# Patient Record
Sex: Female | Born: 1970 | Race: White | Hispanic: No | Marital: Married | Smoking: Never smoker
Health system: Southern US, Community
[De-identification: ages and names within clinical notes are randomized; demographics above are authoritative.]

## PROBLEM LIST (undated history)

## (undated) DIAGNOSIS — M7500 Adhesive capsulitis of unspecified shoulder: Secondary | ICD-10-CM

## (undated) DIAGNOSIS — R7303 Prediabetes: Secondary | ICD-10-CM

## (undated) HISTORY — PX: RETINAL DETACHMENT SURGERY: SHX105

## (undated) HISTORY — PX: HERNIA REPAIR: SHX51

## (undated) HISTORY — PX: DILATION AND CURETTAGE OF UTERUS: SHX78

---

## 2004-12-27 ENCOUNTER — Ambulatory Visit: Payer: Self-pay

## 2005-01-03 ENCOUNTER — Ambulatory Visit: Payer: Self-pay

## 2005-01-17 ENCOUNTER — Ambulatory Visit: Payer: Self-pay | Admitting: General Surgery

## 2005-01-17 HISTORY — PX: BREAST BIOPSY: SHX20

## 2005-09-23 ENCOUNTER — Ambulatory Visit: Payer: Self-pay | Admitting: General Surgery

## 2006-03-27 ENCOUNTER — Ambulatory Visit: Payer: Self-pay | Admitting: General Surgery

## 2006-04-04 ENCOUNTER — Ambulatory Visit: Payer: Self-pay | Admitting: General Surgery

## 2009-05-28 ENCOUNTER — Ambulatory Visit: Payer: Self-pay | Admitting: Obstetrics and Gynecology

## 2014-02-25 ENCOUNTER — Ambulatory Visit: Payer: Self-pay | Admitting: Obstetrics and Gynecology

## 2014-03-20 ENCOUNTER — Ambulatory Visit: Payer: Self-pay | Admitting: Obstetrics and Gynecology

## 2014-03-20 LAB — CBC WITH DIFFERENTIAL/PLATELET
BASOS ABS: 0 10*3/uL (ref 0.0–0.1)
BASOS PCT: 0.8 %
EOS PCT: 5.6 %
Eosinophil #: 0.3 10*3/uL (ref 0.0–0.7)
HCT: 41.6 % (ref 35.0–47.0)
HGB: 13.6 g/dL (ref 12.0–16.0)
LYMPHS PCT: 26.8 %
Lymphocyte #: 1.6 10*3/uL (ref 1.0–3.6)
MCH: 29.4 pg (ref 26.0–34.0)
MCHC: 32.8 g/dL (ref 32.0–36.0)
MCV: 90 fL (ref 80–100)
Monocyte #: 0.6 x10 3/mm (ref 0.2–0.9)
Monocyte %: 9.9 %
NEUTROS ABS: 3.5 10*3/uL (ref 1.4–6.5)
Neutrophil %: 56.9 %
Platelet: 201 10*3/uL (ref 150–440)
RBC: 4.64 10*6/uL (ref 3.80–5.20)
RDW: 13.5 % (ref 11.5–14.5)
WBC: 6.1 10*3/uL (ref 3.6–11.0)

## 2014-03-20 LAB — BASIC METABOLIC PANEL
Anion Gap: 5 — ABNORMAL LOW (ref 7–16)
BUN: 10 mg/dL (ref 7–18)
CALCIUM: 9 mg/dL (ref 8.5–10.1)
CHLORIDE: 105 mmol/L (ref 98–107)
CO2: 31 mmol/L (ref 21–32)
Creatinine: 0.78 mg/dL (ref 0.60–1.30)
EGFR (African American): >60
EGFR (Non-African Amer.): >60
Glucose: 91 mg/dL (ref 65–99)
OSMOLALITY: 280 (ref 275–301)
POTASSIUM: 4.5 mmol/L (ref 3.5–5.1)
Sodium: 141 mmol/L (ref 136–145)

## 2014-03-28 ENCOUNTER — Ambulatory Visit: Payer: Self-pay | Admitting: Obstetrics and Gynecology

## 2014-06-23 LAB — SURGICAL PATHOLOGY

## 2014-06-29 NOTE — Op Note (Signed)
PATIENT NAME:  April Higgins, April Higgins MR#:  [id number] DATE OF BIRTH:  [birth year]  DATE OF PROCEDURE:  03/28/2014  PREOPERATIVE DIAGNOSIS: Pelvic pain, pelvic pressure, and bleeding status post NovaSure ablation.   POSTOPERATIVE DIAGNOSIS: Pelvic pain, pelvic pressure, and bleeding status post NovaSure ablation.   PROCEDURES:  1.  Dilation and curettage.  2.  Hysteroscopy.   SURGEON: [name], MD.    ESTIMATED BLOOD LOSS: Minimal.   INTRAVENOUS FLUIDS: 800 mL.   URINE OUTPUT: 25 mL.   SPECIMENS: Endometrial curettings, endocervical curettings.   FINDINGS: A normal small mobile uterus with a normal cervical canal, a small scarred atrophic endometrial cavity with small uterine adhesions.   COMPLICATIONS: None.   CONDITION: Stable to PACU.   INDICATION FOR PROCEDURE: April Higgins is a 44 year old female with a history of menorrhagia treated by NovaSure ablation in 2011. She presents with pelvic pain, pressure, and continued endometrial bleeding. Office workup was negative including a transvaginal ultrasound which showed endometrial stripe of 5 mm. The decision was made to proceed with D and C hysteroscopy for both therapeutic and diagnostic treatment of the endometrial lining. The patient does decline hysterectomy or other intervention at this time. Consents were signed.   PROCEDURE:  April Higgins was taken to the operating room where she was identified by name and birthdate. She was placed on the operating table in the supine position and general anesthesia was induced without difficulty. She was then placed in a dorsal lithotomy position using candy cane stirrups for the short procedure. She was prepped and draped in the usual sterile fashion. A formal timeout procedure was performed with all team members present and in agreement.   A weighted speculum was placed in the vagina.  A single-tooth tenaculum was used to grasp the anterior lip of the cervix. Her cervix was opened  and a uterine sound sounded to 6 cm. Hysteroscopy was introduced without complication and revealed a normal cervical canal with a small atrophic-appearing, slightly scarred endometrial lining as above. These adhesions were taken down and endometrial curettings and endocervical curettings were collected. The patient tolerated the procedure well. All instruments were removed from the vagina and the single-tooth tenaculum sites were hemostatic prior to reversing anesthesia.   General anesthesia was reversed without difficulty, and the patient is stable in recovery.    ____________________________ Angelina Pih, MD beb:bu D: 03/28/2014 11:13:23 ET T: 03/28/2014 14:36:16 ET JOB#: [id number]  cc: Angelina Pih, MD, <Dictator> Angelina Pih MD ELECTRONICALLY SIGNED 04/13/2014 10:19

## 2015-11-05 ENCOUNTER — Other Ambulatory Visit: Payer: Self-pay | Admitting: Obstetrics and Gynecology

## 2015-11-05 DIAGNOSIS — Z1231 Encounter for screening mammogram for malignant neoplasm of breast: Secondary | ICD-10-CM

## 2015-11-10 ENCOUNTER — Ambulatory Visit: Admission: RE | Admit: 2015-11-10 | Payer: Self-pay | Source: Ambulatory Visit

## 2015-11-12 ENCOUNTER — Ambulatory Visit: Admission: RE | Admit: 2015-11-12 | Payer: Self-pay | Source: Ambulatory Visit

## 2015-11-16 ENCOUNTER — Ambulatory Visit
Admission: RE | Admit: 2015-11-16 | Discharge: 2015-11-16 | Disposition: A | Discharge status: Home / Self Care | Payer: BC Managed Care – PPO | Source: Ambulatory Visit | Attending: Obstetrics and Gynecology | Admitting: Obstetrics and Gynecology

## 2015-11-16 ENCOUNTER — Other Ambulatory Visit: Payer: Self-pay | Admitting: Obstetrics and Gynecology

## 2015-11-16 DIAGNOSIS — Z1231 Encounter for screening mammogram for malignant neoplasm of breast: Secondary | ICD-10-CM | POA: Diagnosis not present

## 2016-12-08 ENCOUNTER — Other Ambulatory Visit: Payer: Self-pay | Admitting: Obstetrics and Gynecology

## 2017-01-27 ENCOUNTER — Other Ambulatory Visit: Payer: Self-pay | Admitting: Obstetrics and Gynecology

## 2017-01-27 DIAGNOSIS — Z1231 Encounter for screening mammogram for malignant neoplasm of breast: Secondary | ICD-10-CM

## 2017-02-14 ENCOUNTER — Ambulatory Visit: Payer: BC Managed Care – PPO

## 2017-03-06 ENCOUNTER — Ambulatory Visit
Admission: RE | Admit: 2017-03-06 | Discharge: 2017-03-06 | Disposition: A | Discharge status: Home / Self Care | Payer: BC Managed Care – PPO | Source: Ambulatory Visit | Attending: Obstetrics and Gynecology | Admitting: Obstetrics and Gynecology

## 2017-03-06 ENCOUNTER — Other Ambulatory Visit: Payer: Self-pay | Admitting: Obstetrics and Gynecology

## 2017-03-06 DIAGNOSIS — Z1231 Encounter for screening mammogram for malignant neoplasm of breast: Secondary | ICD-10-CM | POA: Insufficient documentation

## 2017-03-25 ENCOUNTER — Other Ambulatory Visit: Payer: Self-pay

## 2017-03-25 ENCOUNTER — Ambulatory Visit
Admission: EM | Admit: 2017-03-25 | Discharge: 2017-03-25 | Disposition: A | Discharge status: Home / Self Care | Payer: BC Managed Care – PPO | Attending: Family Medicine | Admitting: Family Medicine

## 2017-03-25 ENCOUNTER — Encounter: Payer: Self-pay | Admitting: Gynecology

## 2017-03-25 DIAGNOSIS — J020 Streptococcal pharyngitis: Secondary | ICD-10-CM

## 2017-03-25 DIAGNOSIS — J029 Acute pharyngitis, unspecified: Secondary | ICD-10-CM

## 2017-03-25 DIAGNOSIS — R509 Fever, unspecified: Secondary | ICD-10-CM | POA: Diagnosis not present

## 2017-03-25 LAB — RAPID STREP SCREEN (MED CTR MEBANE ONLY): Streptococcus, Group A Screen (Direct): POSITIVE — AB

## 2017-03-25 MED ORDER — AMOXICILLIN 500 MG PO TABS: 500.0000 mg | ORAL_TABLET | Freq: Two times a day (BID) | ORAL | 0 refills | Status: AC

## 2017-03-25 NOTE — ED Triage Notes (Signed)
Patient c/o sore throat x last pm. Per patient fever x this morning of 100.6.

## 2017-03-25 NOTE — ED Provider Notes (Signed)
[facility]    CSN: [id number] Arrival date & time: 03/25/17  0957  History   Chief Complaint Chief Complaint  Patient presents with  . Sore Throat  . Fever   HPI   47 year old female presents with sore throat and fever.  Sore throat started last night.  She thought she was just developing a cold.  This morning however, she was worse.  Sore throat was more severe and she had an associated fever of 100.6.  She took 2 Advil this morning and has had improvement in her fever.  No reports of body aches.  No other associated respiratory symptoms.  No known exacerbating factors.  No other associated symptoms.  No other complaints at this time.  PMH:  Glucose intolerance (impaired glucose tolerance) 11/15/2015  BMI 34.0-34.9,adult 11/15/2015  Pelvic pressure in female 01/15/2014  Overview:   S/p Novasure ablation 2011. TVUS with endometrial stripe of 5.56mm, total volume 174cm, normal ovaries.     Past Surgical History:  Procedure Laterality Date  . BREAST BIOPSY Left 01/17/2005   bx/u/s-neg   OB History    No data available     Home Medications    Prior to Admission medications   Medication Sig Start Date End Date Taking? Authorizing Provider  amoxicillin (AMOXIL) 500 MG tablet Take 1 tablet (500 mg total) by mouth 2 (two) times daily for 10 days. 03/25/17 04/04/17  Coral Spikes, DO   Family History Family History  Problem Relation Age of Onset  . Breast cancer Mother 74    Social History Social History   Tobacco Use  . Smoking status: Never Smoker  . Smokeless tobacco: Never Used  Substance Use Topics  . Alcohol use: Not on file  . Drug use: Not on file     Allergies   Sulfa antibiotics   Review of Systems Review of Systems  Constitutional: Positive for fever.  HENT: Positive for sore throat.   Musculoskeletal:       No body aches.    Physical Exam Triage Vital Signs ED Triage Vitals  Enc Vitals Group     BP 03/25/17 1040 112/61   Pulse Rate 03/25/17 1040 89     Resp 03/25/17 1040 16     Temp 03/25/17 1040 98.2 F (36.8 C)     Temp src --      SpO2 03/25/17 1040 99 %     Weight 03/25/17 1040 174 lb (78.9 kg)     Height 03/25/17 1040 5\' 7"  (1.702 m)     Head Circumference --      Peak Flow --      Pain Score 03/25/17 1041 0     Pain Loc --      Pain Edu? --      Excl. in [place]? --    Updated Vital Signs BP 112/61 (BP Location: Left Arm)   Pulse 89   Temp 98.2 F (36.8 C)   Resp 16   Ht 5\' 7"  (1.702 m)   Wt 174 lb (78.9 kg)   SpO2 99%   BMI 27.25 kg/m     Physical Exam  Constitutional: She is oriented to person, place, and time. She appears well-developed and well-nourished. No distress.  HENT:  Head: Normocephalic and atraumatic.  Oropharynx with tonsillar edema and erythema.  Tonsillar edema greater on the right.  No exudate.  Uvula midline.  No evidence of peritonsillar abscess.  Eyes: Conjunctivae are normal. Right eye exhibits no discharge. Left eye  exhibits no discharge.  Neck: Neck supple.  Cardiovascular: Normal rate and regular rhythm.  No murmur heard. Pulmonary/Chest: Effort normal and breath sounds normal. She has no wheezes. She has no rales.  Lymphadenopathy:    She has no cervical adenopathy.  Neurological: She is alert and oriented to person, place, and time.  Psychiatric: She has a normal mood and affect. Her behavior is normal.  Nursing note and vitals reviewed.  UC Treatments / Results  Labs (all labs ordered are listed, but only abnormal results are displayed) Labs Reviewed  RAPID STREP SCREEN (NOT AT [facility]) - Abnormal; Notable for the following components:      Result Value   Streptococcus, Group A Screen (Direct) POSITIVE (*)    All other components within normal limits    EKG  EKG Interpretation None       Radiology No results found.  Procedures Procedures (including critical care time)  Medications Ordered in UC Medications - No data to display   Initial  Impression / Assessment and Plan / UC Course  I have reviewed the triage vital signs and the nursing notes.  Pertinent labs & imaging results that were available during my care of the patient were reviewed by me and considered in my medical decision making (see chart for details).     47 year old female presents with strep pharyngitis.  Treating with amoxicillin.  Final Clinical Impressions(s) / UC Diagnoses   Final diagnoses:  Strep pharyngitis    ED Discharge Orders        Ordered    amoxicillin (AMOXIL) 500 MG tablet  2 times daily     03/25/17 1128     Controlled Substance Prescriptions [place] Controlled Substance Registry consulted? Not Applicable   Coral Spikes, DO 03/25/17 1139

## 2017-03-28 ENCOUNTER — Telehealth: Payer: Self-pay | Admitting: *Deleted

## 2017-03-28 NOTE — Telephone Encounter (Signed)
Called patient, verified DOB, patient reported feeling better. Advised patient to follow up with PCP if symptoms return.

## 2018-10-16 ENCOUNTER — Encounter
Admission: RE | Admit: 2018-10-16 | Discharge: 2018-10-16 | Disposition: A | Discharge status: Home / Self Care | Payer: BC Managed Care – PPO | Source: Ambulatory Visit | Attending: Obstetrics and Gynecology | Admitting: Obstetrics and Gynecology

## 2018-10-16 ENCOUNTER — Other Ambulatory Visit: Payer: Self-pay

## 2018-10-16 DIAGNOSIS — Z01812 Encounter for preprocedural laboratory examination: Secondary | ICD-10-CM | POA: Insufficient documentation

## 2018-10-16 DIAGNOSIS — Z20828 Contact with and (suspected) exposure to other viral communicable diseases: Secondary | ICD-10-CM | POA: Diagnosis not present

## 2018-10-16 HISTORY — DX: Prediabetes: R73.03

## 2018-10-16 HISTORY — DX: Adhesive capsulitis of unspecified shoulder: M75.00

## 2018-10-16 NOTE — Patient Instructions (Signed)
Your procedure is scheduled FX:[id number] 8/21 Report to Day Surgery. To find out your arrival time please call [phone] between 1PM - 3PM on Thurs 8/20.  Remember: Instructions that are not followed completely may result in serious medical risk,  up to and including death, or upon the discretion of your surgeon and anesthesiologist your  surgery may need to be rescheduled.     _X__ 1. Do not eat food after midnight the night before your procedure.                 No gum chewing or hard candies. You may drink clear liquids up to 2 hours                 before you are scheduled to arrive for your surgery- DO not drink clear                 liquids within 2 hours of the start of your surgery.                 Clear Liquids include:  water, apple juice without pulp, clear carbohydrate                 drink such as Clearfast of Gatorade, Black Coffee or Tea (Do not add                 anything to coffee or tea).  __X__2.  On the morning of surgery brush your teeth with toothpaste and water, you                may rinse your mouth with mouthwash if you wish.  Do not swallow any toothpaste of mouthwash.     ___ 3.  No Alcohol for 24 hours before or after surgery.   ___ 4.  Do Not Smoke or use e-cigarettes For 24 Hours Prior to Your Surgery.                 Do not use any chewable tobacco products for at least 6 hours prior to                 surgery.  ____  5.  Bring all medications with you on the day of surgery if instructed.   _x___  6.  Notify your doctor if there is any change in your medical condition      (cold, fever, infections).     Do not wear jewelry, make-up, hairpins, clips or nail polish. Do not wear lotions, powders, or perfumes. You may wear deodorant. Do not shave 48 hours prior to surgery. Men may shave face and neck. Do not bring valuables to the hospital.    [facility] is not responsible for any belongings or valuables.  Contacts, dentures  or bridgework may not be worn into surgery. Leave your suitcase in the car. After surgery it may be brought to your room. For patients admitted to the hospital, discharge time is determined by your treatment team.   Patients discharged the day of surgery will not be allowed to drive home.   Please read over the following fact sheets that you were given:    ____ Take these medicines the morning of surgery with A SIP OF WATER:    1. none  2.   3.   4.  5.  6.  ____ Fleet Enema (as directed)   ____ Use CHG Soap as directed  ____ Use inhalers on the day of surgery  ____ Stop metformin 2 days prior to surgery    ____ Take 1/2 of usual insulin dose the night before surgery. No insulin the morning          of surgery.   ____ Stop Coumadin/Plavix/aspirin on   __x__ Stop Anti-inflammatories No ibuprofen, Aleve or Aspirin   ____ Stop supplements until after surgery.    ____ Bring C-Pap to the hospital.

## 2018-10-17 LAB — SARS CORONAVIRUS 2 (TAT 6-24 HRS): SARS Coronavirus 2: NEGATIVE

## 2018-10-19 ENCOUNTER — Ambulatory Visit
Admission: RE | Admit: 2018-10-19 | Discharge: 2018-10-19 | Disposition: A | Discharge status: Home / Self Care | Payer: BC Managed Care – PPO | Attending: Obstetrics and Gynecology | Admitting: Obstetrics and Gynecology

## 2018-10-19 ENCOUNTER — Ambulatory Visit: Payer: BC Managed Care – PPO | Admitting: Anesthesiology

## 2018-10-19 ENCOUNTER — Encounter
Admission: RE | Disposition: A | Discharge status: Home / Self Care | Payer: Self-pay | Source: Home / Self Care | Attending: Obstetrics and Gynecology

## 2018-10-19 ENCOUNTER — Other Ambulatory Visit: Payer: Self-pay

## 2018-10-19 ENCOUNTER — Encounter: Payer: Self-pay | Admitting: *Deleted

## 2018-10-19 DIAGNOSIS — N882 Stricture and stenosis of cervix uteri: Secondary | ICD-10-CM | POA: Insufficient documentation

## 2018-10-19 DIAGNOSIS — R7303 Prediabetes: Secondary | ICD-10-CM | POA: Diagnosis not present

## 2018-10-19 DIAGNOSIS — N950 Postmenopausal bleeding: Secondary | ICD-10-CM | POA: Diagnosis not present

## 2018-10-19 DIAGNOSIS — Z79899 Other long term (current) drug therapy: Secondary | ICD-10-CM | POA: Diagnosis not present

## 2018-10-19 HISTORY — PX: HYSTEROSCOPY WITH D & C: SHX1775

## 2018-10-19 LAB — CBC
HCT: 41.7 % (ref 36.0–46.0)
Hemoglobin: 13.9 g/dL (ref 12.0–15.0)
MCH: 30.8 pg (ref 26.0–34.0)
MCHC: 33.3 g/dL (ref 30.0–36.0)
MCV: 92.5 fL (ref 80.0–100.0)
Platelets: 193 10*3/uL (ref 150–400)
RBC: 4.51 MIL/uL (ref 3.87–5.11)
RDW: 12.7 % (ref 11.5–15.5)
WBC: 5.8 10*3/uL (ref 4.0–10.5)
nRBC: 0 % (ref 0.0–0.2)

## 2018-10-19 LAB — BASIC METABOLIC PANEL
Anion gap: 4 — ABNORMAL LOW (ref 5–15)
BUN: 16 mg/dL (ref 6–20)
CO2: 28 mmol/L (ref 22–32)
Calcium: 9.3 mg/dL (ref 8.9–10.3)
Chloride: 108 mmol/L (ref 98–111)
Creatinine, Ser: 0.52 mg/dL (ref 0.44–1.00)
GFR calc Af Amer: >60 mL/min (ref >60)
GFR calc non Af Amer: >60 mL/min (ref >60)
Glucose, Bld: 89 mg/dL (ref 70–99)
Potassium: 3.6 mmol/L (ref 3.5–5.1)
Sodium: 140 mmol/L (ref 135–145)

## 2018-10-19 LAB — TYPE AND SCREEN
ABO/RH(D): B POS
Antibody Screen: NEGATIVE

## 2018-10-19 SURGERY — DILATATION AND CURETTAGE /HYSTEROSCOPY
Anesthesia: General

## 2018-10-19 MED ORDER — LACTATED RINGERS IV SOLN: INTRAVENOUS | Status: DC

## 2018-10-19 MED ORDER — IBUPROFEN 800 MG PO TABS: 800.0000 mg | ORAL_TABLET | Freq: Three times a day (TID) | ORAL | 1 refills | Status: DC | PRN

## 2018-10-19 MED ORDER — LACTATED RINGERS IV SOLN
INTRAVENOUS | Status: DC | PRN
  Administered 2018-10-19: 11:00:00 via INTRAVENOUS

## 2018-10-19 MED ORDER — ONDANSETRON HCL 4 MG/2ML IJ SOLN
INTRAMUSCULAR | Status: DC | PRN
  Administered 2018-10-19: 4 mg via INTRAVENOUS

## 2018-10-19 MED ORDER — PROPOFOL 10 MG/ML IV BOLUS
INTRAVENOUS | Status: AC
  Filled 2018-10-19: qty 20

## 2018-10-19 MED ORDER — LIDOCAINE HCL (CARDIAC) PF 100 MG/5ML IV SOSY
PREFILLED_SYRINGE | INTRAVENOUS | Status: DC | PRN
  Administered 2018-10-19: 100 mg via INTRAVENOUS

## 2018-10-19 MED ORDER — FENTANYL CITRATE (PF) 100 MCG/2ML IJ SOLN
INTRAMUSCULAR | Status: DC | PRN
  Administered 2018-10-19: 50 ug via INTRAVENOUS

## 2018-10-19 MED ORDER — MIDAZOLAM HCL 2 MG/2ML IJ SOLN
INTRAMUSCULAR | Status: DC | PRN
  Administered 2018-10-19: 2 mg via INTRAVENOUS

## 2018-10-19 MED ORDER — MIDAZOLAM HCL 2 MG/2ML IJ SOLN
INTRAMUSCULAR | Status: AC
  Filled 2018-10-19: qty 2

## 2018-10-19 MED ORDER — PROPOFOL 10 MG/ML IV BOLUS
INTRAVENOUS | Status: DC | PRN
  Administered 2018-10-19: 160 mg via INTRAVENOUS

## 2018-10-19 MED ORDER — FENTANYL CITRATE (PF) 100 MCG/2ML IJ SOLN: 25.0000 ug | INTRAMUSCULAR | Status: DC | PRN

## 2018-10-19 MED ORDER — OXYCODONE HCL 5 MG/5ML PO SOLN: 5.0000 mg | Freq: Once | ORAL | Status: DC | PRN

## 2018-10-19 MED ORDER — DEXAMETHASONE SODIUM PHOSPHATE 10 MG/ML IJ SOLN
INTRAMUSCULAR | Status: DC | PRN
  Administered 2018-10-19: 10 mg via INTRAVENOUS

## 2018-10-19 MED ORDER — FAMOTIDINE 20 MG PO TABS
ORAL_TABLET | ORAL | Status: AC
  Filled 2018-10-19: qty 1

## 2018-10-19 MED ORDER — FAMOTIDINE 20 MG PO TABS
20.0000 mg | ORAL_TABLET | Freq: Once | ORAL | Status: AC
  Administered 2018-10-19: 10:00:00 20 mg via ORAL

## 2018-10-19 MED ORDER — OXYCODONE HCL 5 MG PO TABS: 5.0000 mg | ORAL_TABLET | Freq: Once | ORAL | Status: DC | PRN

## 2018-10-19 MED ORDER — FENTANYL CITRATE (PF) 100 MCG/2ML IJ SOLN
INTRAMUSCULAR | Status: AC
  Filled 2018-10-19: qty 2

## 2018-10-19 SURGICAL SUPPLY — 20 items
BAG INFUSER PRESSURE 100CC (MISCELLANEOUS) ×3 IMPLANT
CANISTER SUCT 3000ML PPV (MISCELLANEOUS) ×3 IMPLANT
CATH ROBINSON RED A/P 16FR (CATHETERS) ×3 IMPLANT
COVER WAND RF STERILE (DRAPES) ×3 IMPLANT
DEVICE MYOSURE LITE (MISCELLANEOUS) ×3 IMPLANT
ELECT REM PT RETURN 9FT ADLT (ELECTROSURGICAL) ×3
ELECTRODE REM PT RTRN 9FT ADLT (ELECTROSURGICAL) ×1 IMPLANT
GLOVE BIO SURGEON STRL SZ7 (GLOVE) ×3 IMPLANT
GLOVE INDICATOR 7.5 STRL GRN (GLOVE) ×3 IMPLANT
GOWN STRL REUS W/ TWL LRG LVL3 (GOWN DISPOSABLE) ×2 IMPLANT
GOWN STRL REUS W/TWL LRG LVL3 (GOWN DISPOSABLE) ×4
KIT PROCEDURE FLUENT (KITS) ×6 IMPLANT
KIT TURNOVER CYSTO (KITS) ×3 IMPLANT
PACK DNC HYST (MISCELLANEOUS) ×3 IMPLANT
PAD OB MATERNITY 4.3X12.25 (PERSONAL CARE ITEMS) ×3 IMPLANT
PAD PREP 24X41 OB/GYN DISP (PERSONAL CARE ITEMS) ×3 IMPLANT
SOL .9 NS 3000ML IRR  AL (IV SOLUTION) ×2
SOL .9 NS 3000ML IRR UROMATIC (IV SOLUTION) ×1 IMPLANT
TUBING CONNECTING 10 (TUBING) ×2 IMPLANT
TUBING CONNECTING 10' (TUBING) ×1

## 2018-10-19 NOTE — Anesthesia Procedure Notes (Signed)
Procedure Name: LMA Insertion Date/Time: 10/19/2018 11:01 AM Performed by: Justus Memory, CRNA Pre-anesthesia Checklist: Patient identified, Patient being monitored, Timeout performed, Emergency Drugs available and Suction available Patient Re-evaluated:Patient Re-evaluated prior to induction Oxygen Delivery Method: Circle system utilized Preoxygenation: Pre-oxygenation with 100% oxygen Induction Type: IV induction Ventilation: Mask ventilation without difficulty LMA: LMA inserted LMA Size: 3.5 Tube type: Oral Number of attempts: 1 Placement Confirmation: positive ETCO2 and breath sounds checked- equal and bilateral Tube secured with: Tape Dental Injury: Teeth and Oropharynx as per pre-operative assessment

## 2018-10-19 NOTE — Discharge Instructions (Signed)
Discharge instructions after a hysteroscopy with dilation and curettage  Signs and Symptoms to Report  Call our office at [phone] if you have any of the following:   . Fever over 100.4 degrees or higher . Severe stomach pain not relieved with pain medications . Bright red bleeding that's heavier than a period that does not slow with rest after the first 24 hours . To go the bathroom a lot (frequency), you can't hold your urine (urgency), or it hurts when you empty your bladder (urinate) . Chest pain . Shortness of breath . Pain in the calves of your legs . Severe nausea and vomiting not relieved with anti-nausea medications . Any concerns  What You Can Expect after Surgery . You may see some pink tinged, bloody fluid. This is normal. You may also have cramping for several days.   Activities after Your Discharge Follow these guidelines to help speed your recovery at home: . Don't drive if you are in pain or taking narcotic pain medicine. You may drive when you can safely slam on the brakes, turn the wheel forcefully, and rotate your torso comfortably. This is typically 4-7 days. Practice in a parking lot or side street prior to attempting to drive regularly.  . Ask others to help with household chores for 4 weeks. . Don't do strenuous activities, exercises, or sports like vacuuming, tennis, squash, etc. until your doctor says it is safe to do so. . Walk as you feel able. Rest often since it may take a week or two for your energy level to return to normal.  . You may climb stairs . Avoid constipation:   -Eat fruits, vegetables, and whole grains. Eat small meals as your appetite will take time to return to normal.   -Drink 6 to 8 glasses of water each day unless your doctor has told you to limit your fluids.   -Use a laxative or stool softener as needed if constipation becomes a problem. You may take Miralax, metamucil, Citrucil, Colace, Senekot, FiberCon, etc. If this does not  relieve the constipation, try two tablespoons of Milk Of Magnesia every 8 hours until your bowels move.  . You may shower.  . Do not get in a hot tub, swimming pool, etc. until your doctor agrees. . Do not douche, use tampons, or have sex until your doctor says it is okay, usually about 2 weeks. . Take your pain medicine when you need it. The medicine may not work as well if the pain is bad.  Take the medicines you were taking before surgery. Other medications you might need are pain medications (ibuprofen), medications for constipation (Colace) and nausea medications (Zofran).        AMBULATORY SURGERY  DISCHARGE INSTRUCTIONS   1) The drugs that you were given will stay in your system until tomorrow so for the next 24 hours you should not:  A) Drive an automobile B) Make any legal decisions C) Drink any alcoholic beverage   2) You may resume regular meals tomorrow.  Today it is better to start with liquids and gradually work up to solid foods.  You may eat anything you prefer, but it is better to start with liquids, then soup and crackers, and gradually work up to solid foods.   3) Please notify your doctor immediately if you have any unusual bleeding, trouble breathing, redness and pain at the surgery site, drainage, fever, or pain not relieved by medication.    4) Additional Instructions:          Please contact your physician with any problems or Same Day Surgery at [phone], Monday through Friday 6 am to 4 pm, or [place] [facility] number at [phone]. 

## 2018-10-19 NOTE — Anesthesia Post-op Follow-up Note (Signed)
Anesthesia QCDR form completed.        

## 2018-10-19 NOTE — Transfer of Care (Signed)
Immediate Anesthesia Transfer of Care Note  Patient: April Higgins  Procedure(s) Performed: DILATATION AND CURETTAGE /HYSTEROSCOPY (N/A )  Patient Location: PACU  Anesthesia Type:General  Level of Consciousness: awake  Airway & Oxygen Therapy: Patient Spontanous Breathing and Patient connected to nasal cannula oxygen  Post-op Assessment: Report given to RN and Post -op Vital signs reviewed and stable  Post vital signs: Reviewed and stable  Last Vitals:  Vitals Value Taken Time  BP 121/65 10/19/18 1147  Temp    Pulse 48 10/19/18 1148  Resp 15 10/19/18 1148  SpO2 100 % 10/19/18 1148  Vitals shown include unvalidated device data.  Last Pain:  Vitals:   10/19/18 0916  TempSrc: Temporal  PainSc: 0-No pain         Complications: No apparent anesthesia complications

## 2018-10-19 NOTE — Op Note (Signed)
Operative Report Hysteroscopy with Dilation and Curettage   Indications: Postmenopausal bleeding with incomplete office sampling  Pre-operative Diagnosis:  -  PMB - with cervical stenosis   Post-operative Diagnosis: same.  Procedure: 1. Exam under anesthesia 2. Fractional D&C 3. Hysteroscopy  Surgeon: [name], MD  Assistant(s):  None  Anesthesia: General LMA anesthesia  Anesthesiologist: Gunnar Fusi, MD Anesthesiologist: Gunnar Fusi, MD; Piscitello, Precious Haws, MD CRNA: Justus Memory, CRNA; Bernardo Heater, CRNA  Estimated Blood Loss:  Minimal         Intraoperative medications: none         Total IV Fluids: 317ml  Urine Output: 622ml  Total Fluid Deficit:  395 mL          Specimens: Endocervical curettings, endometrial curettings         Complications:  None; patient tolerated the procedure well.         Disposition: PACU - hemodynamically stable.         Condition: stable  Findings: Uterus unable to be sufficiently measured and likely insufficiently sampled too; I was able to dilate the cervix and enter visually, but did not see an open cavity ever. The space at the top of the cervix appeared to open somewhat and this is what I sampled, but under full visualization all the time. Neither ostia visualized. Normal cervix, vagina, perineum.   Indication for procedure/Consents: 48 y.o. F here for scheduled surgery for the aforementioned diagnoses.   Risks of surgery were discussed with the patient including but not limited to: bleeding which may require transfusion; infection which may require antibiotics; injury to uterus or surrounding organs; intrauterine scarring which may impair future fertility; need for additional procedures including laparotomy or laparoscopy; and other postoperative/anesthesia complications. Written informed consent was obtained.    Procedure Details:  Fractional D&C only  The patient was taken to the operating room  where anesthesia was administered and was found to be adequate. After a formal and adequate timeout was performed, she was placed in the dorsal lithotomy position and examined with the above findings. She was then prepped and draped in the sterile manner. Her bladder was catheterized for an estimated amount of clear, yellow urine. A weighed speculum was then placed in the patient's vagina and a single tooth tenaculum was applied to the anterior lip of the cervix.  Her cervix was serially dilated to 15 [place] using Hanks dilators. An ECC was performed. The hysteroscope was introduced to reveal the above findings. The myosure was introduced under direct visualization and sampled in strips. A sharp curettage was then gently performed until there was a gritty texture in all four quadrants. The tenaculum was removed from the anterior lip of the cervix and the vaginal speculum was removed after applying silver nitrate for good hemostasis.   The patient tolerated the procedure well and was taken to the recovery area awake and in stable condition. She received iv acetaminophen and Toradol prior to leaving the OR.  The patient will be discharged to home as per PACU criteria. Routine postoperative instructions given. She was prescribed Ibuprofen and Colace. She will follow up in the clinic in two weeks for postoperative evaluation.

## 2018-10-19 NOTE — Interval H&P Note (Signed)
History and Physical Interval Note:  10/19/2018 10:46 AM  April Higgins  has presented today for surgery, with the diagnosis of postmenopausal bleeding, cervical stenosis.  The various methods of treatment have been discussed with the patient and family. After consideration of risks, benefits and other options for treatment, the patient has consented to  Procedure(s): DILATATION AND CURETTAGE /HYSTEROSCOPY (N/A) as a surgical intervention.  The patient's history has been reviewed, patient examined, no change in status, stable for surgery.  I have reviewed the patient's chart and labs.  Questions were answered to the patient's satisfaction.     [name]

## 2018-10-19 NOTE — Anesthesia Postprocedure Evaluation (Signed)
Anesthesia Post Note  Patient: April Higgins  Procedure(s) Performed: DILATATION AND CURETTAGE /HYSTEROSCOPY (N/A )  Patient location during evaluation: PACU Anesthesia Type: General Level of consciousness: awake and alert Pain management: pain level controlled Vital Signs Assessment: post-procedure vital signs reviewed and stable Respiratory status: spontaneous breathing and respiratory function stable Cardiovascular status: stable Anesthetic complications: no     Last Vitals:  Vitals:   10/19/18 0916 10/19/18 1147  BP: 117/77 (P) 121/65  Pulse: (!) 50   Resp: 18   Temp: 36.4 C (!) (P) 36.2 C  SpO2: 99%     Last Pain:  Vitals:   10/19/18 0916  TempSrc: Temporal  PainSc: 0-No pain                 KEPHART,WILLIAM K

## 2018-10-19 NOTE — H&P (Signed)
April Higgins is a 48 y.o. female here for Pre Op Consulting (schedule D&C/pmb after ablation)  History of Present Illness: -Patient returns for discussion regarding plans for a D&C Hysteroscopy indicated by her postmenopausal bleeding. Patient has had an ablation and has cervical stenosis which has made endometrial biopsies difficult to obtain. Her postmenopausal bleeding is persistent and further assessment in the operating room is warranted.  She is having difficulty emptying bladder  Body mass index is 27.37 kg/m.  Work Up:  -Boomer, LH, & E2 labs confirm patient is postmenopausal  -TVUS 03/2018 Uterus 8.35 x 4.52 x 6.09 cm = 120.35 ml , anteflexed Fibroid seen: Rt lateral=1.4cm Endometrium=3.65mm Septated nabothian cyst=1.9cm; septation=0.18cm No free fluid seen B/L ovaries appear wnl  -EMBx 2020: (Patient is s/p ablation; difficult embx)  Very rare strips of inactive endometrium are seen in a specimen that consists mainly of benign endocervical glands, squamous metaplasia, mucus, and blood.  -EMBx 2019: NO HYPERPLASIA OR CARCINOMA. SCANT BENIGN ENDOMETRIAL FRAGMENTS. MINUTE  FRAGMENTS OF BENIGN ENDOCERVICAL GLANDS, MUCUS, AND BLOOD.   -Endometrial ablation 08/2009 with Dr. Amalia Hailey- cyclic cramping/bleeding, tried to open stenotic cervix last year without success.  Did D&C, hysteroscopy that returned normal pathology  Surgical Hx: -Endometrial Novasure ablation in 2011 -Hernia repair -C/S x1  Past Medical History:  has a past medical history of Abnormal uterine bleeding, Allergy, Dysmenorrhea, and Hyperlipidemia.  Past Surgical History:  has a past surgical history that includes Femoral hernia repair; Repair Retinal Detachment By Air/Gas; Endometrial ablation w/ novasure (08/28/2009); Incision and drainage breast abscess (Left, 12/03/2009); Hernia repair (1975?); and Cesarean section (07/18/1991). Family History: family history includes Alcohol abuse in her father, mother, and  sister; Asthma in her sister, sister, sister, and sister; Breast cancer (age of onset: 62) in her mother; Depression in her sister; Diabetes type II in her paternal grandmother; High blood pressure (Hypertension) in her father and mother; Hyperlipidemia (Elevated cholesterol) in her father and mother; Obesity in her father, mother, sister, and sister; Stroke in her father. Social History:  reports that she has never smoked. She has never used smokeless tobacco. She reports that she does not drink alcohol or use drugs. OB/GYN History:  OB History    Gravida  3   Para  3   Term  2   Preterm  1   AB      Living  4     SAB      TAB      Ectopic      Molar      Multiple      Live Births  3       Obstetric Comments  1 adopted child in 07/2013      Allergies: is allergic to sulfa (sulfonamide antibiotics). Medications: Current Outpatient Medications:  .  cyanocobalamin (VITAMIN B12) 250 MCG tablet, Take 250 mcg by mouth once daily, Disp: , Rfl:  .  multivitamin tablet, Take 1 tablet by mouth once daily., Disp: , Rfl:  .  albuterol 90 mcg/actuation inhaler, Inhale 2 inhalations into the lungs every 6 (six) hours as needed for Wheezing, Disp: 1 Inhaler, Rfl: 12 .  L.ACID/L.CASEI/B.BIF/B.LON/FOS (PROBIOTIC BLEND ORAL), Take by mouth., Disp: , Rfl:  .  ondansetron (ZOFRAN) 4 MG tablet, Take 1 tablet (4 mg total) by mouth every 8 (eight) hours as needed for Nausea, Disp: 6 tablet, Rfl: 0  Review of Systems: No SOB, no palpitations or chest pain, no new lower extremity edema, no nausea or vomiting or  bowel or bladder complaints. See HPI for gyn specific ROS.   Telehealth Exam:   Ht 172.7 cm (5\' 8" )   Wt 81.6 kg (180 lb)   BMI 27.37 kg/m   Constitutional:  General appearance: Well nourished, well developed female in no acute distress.   Impression:   The primary encounter diagnosis was Post-menopausal bleeding. A diagnosis of Cervical stenosis (uterine cervix) was  also pertinent to this visit.  Plan:   1. Postmenopausal bleeding  Plan for D&C hysteroscopy for PMB with cervical stenosis. If PMB persists or if unable to sample, we discussed minimally invasive hyste.  Risks of surgery were discussed with the patient including but not limited to: bleeding which may require transfusion; infection which may require antibiotics; injury to uterus or surrounding organs; intrauterine scarring which may impair future fertility; need for additional procedures including laparotomy or laparoscopy; and other postoperative/anesthesia complications. Written informed consent was obtained.  This will be a scheduled same-day surgery. She will have a postop visit in 2 weeks to review operative findings and pathology.   Diagnoses and all orders for this visit:  Post-menopausal bleeding  Cervical stenosis (uterine cervix)

## 2018-10-19 NOTE — Anesthesia Preprocedure Evaluation (Signed)
Anesthesia Evaluation  Patient identified by MRN, date of birth, ID band Patient awake    Reviewed: Allergy & Precautions, H&P , NPO status , Patient's Chart, lab work & pertinent test results  History of Anesthesia Complications Negative for: history of anesthetic complications  Airway Mallampati: III  TM Distance: >3 FB Neck ROM: limited    Dental  (+) Chipped   Pulmonary neg pulmonary ROS, neg shortness of breath,           Cardiovascular Exercise Tolerance: Good (-) angina(-) Past MI and (-) DOE negative cardio ROS       Neuro/Psych negative neurological ROS  negative psych ROS   GI/Hepatic negative GI ROS, Neg liver ROS, neg GERD  ,  Endo/Other  negative endocrine ROS  Renal/GU      Musculoskeletal   Abdominal   Peds  Hematology negative hematology ROS (+)   Anesthesia Other Findings Past Medical History: No date: Frozen shoulder     Comment:  right No date: Pre-diabetes  Past Surgical History: 01/17/2005: BREAST BIOPSY; Left     Comment:  bx/u/s-neg No date: CESAREAN SECTION No date: DILATION AND CURETTAGE OF UTERUS No date: HERNIA REPAIR; Right     Comment:  right inguinal No date: RETINAL DETACHMENT SURGERY; Left  BMI    Body Mass Index: 27.45 kg/m      Reproductive/Obstetrics negative OB ROS                             Anesthesia Physical Anesthesia Plan  ASA: II  Anesthesia Plan: General LMA   Post-op Pain Management:    Induction: Intravenous  PONV Risk Score and Plan: Dexamethasone, Ondansetron, Midazolam and Treatment may vary due to age or medical condition  Airway Management Planned: LMA  Additional Equipment:   Intra-op Plan:   Post-operative Plan: Extubation in OR  Informed Consent: I have reviewed the patients History and Physical, chart, labs and discussed the procedure including the risks, benefits and alternatives for the proposed  anesthesia with the patient or authorized representative who has indicated his/her understanding and acceptance.     Dental Advisory Given  Plan Discussed with: Anesthesiologist, CRNA and Surgeon  Anesthesia Plan Comments: (Patient consented for risks of anesthesia including but not limited to:  - adverse reactions to medications - damage to teeth, lips or other oral mucosa - sore throat or hoarseness - Damage to heart, brain, lungs or loss of life  Patient voiced understanding.)        Anesthesia Quick Evaluation

## 2018-10-23 LAB — SURGICAL PATHOLOGY

## 2018-11-16 ENCOUNTER — Other Ambulatory Visit: Payer: Self-pay

## 2018-11-16 ENCOUNTER — Other Ambulatory Visit
Admission: RE | Admit: 2018-11-16 | Discharge: 2018-11-16 | Disposition: A | Discharge status: Home / Self Care | Payer: BC Managed Care – PPO | Source: Ambulatory Visit | Attending: Obstetrics and Gynecology | Admitting: Obstetrics and Gynecology

## 2018-11-16 DIAGNOSIS — Z01812 Encounter for preprocedural laboratory examination: Secondary | ICD-10-CM | POA: Diagnosis not present

## 2018-11-16 DIAGNOSIS — Z20828 Contact with and (suspected) exposure to other viral communicable diseases: Secondary | ICD-10-CM | POA: Insufficient documentation

## 2018-11-16 LAB — SARS CORONAVIRUS 2 (TAT 6-24 HRS): SARS Coronavirus 2: NEGATIVE

## 2018-11-20 NOTE — Pre-Procedure Instructions (Signed)
Faxed request for Dr.'s orders and H&P to Dr. Guido Sander office.  DOS 11/21/18.

## 2018-11-21 ENCOUNTER — Other Ambulatory Visit: Payer: Self-pay

## 2018-11-21 ENCOUNTER — Ambulatory Visit
Admission: RE | Admit: 2018-11-21 | Discharge: 2018-11-21 | Disposition: A | Discharge status: Home / Self Care | Payer: BC Managed Care – PPO | Attending: Obstetrics & Gynecology | Admitting: Obstetrics & Gynecology

## 2018-11-21 ENCOUNTER — Ambulatory Visit: Payer: BC Managed Care – PPO | Admitting: Anesthesiology

## 2018-11-21 ENCOUNTER — Encounter
Admission: RE | Disposition: A | Discharge status: Home / Self Care | Payer: Self-pay | Source: Home / Self Care | Attending: Obstetrics & Gynecology

## 2018-11-21 DIAGNOSIS — Z791 Long term (current) use of non-steroidal anti-inflammatories (NSAID): Secondary | ICD-10-CM | POA: Diagnosis not present

## 2018-11-21 DIAGNOSIS — N950 Postmenopausal bleeding: Secondary | ICD-10-CM | POA: Diagnosis present

## 2018-11-21 DIAGNOSIS — D251 Intramural leiomyoma of uterus: Secondary | ICD-10-CM | POA: Insufficient documentation

## 2018-11-21 DIAGNOSIS — N800 Endometriosis of uterus: Secondary | ICD-10-CM | POA: Insufficient documentation

## 2018-11-21 DIAGNOSIS — Z882 Allergy status to sulfonamides status: Secondary | ICD-10-CM | POA: Insufficient documentation

## 2018-11-21 DIAGNOSIS — Z79899 Other long term (current) drug therapy: Secondary | ICD-10-CM | POA: Diagnosis not present

## 2018-11-21 DIAGNOSIS — N888 Other specified noninflammatory disorders of cervix uteri: Secondary | ICD-10-CM | POA: Diagnosis not present

## 2018-11-21 DIAGNOSIS — R7303 Prediabetes: Secondary | ICD-10-CM | POA: Diagnosis not present

## 2018-11-21 DIAGNOSIS — Z803 Family history of malignant neoplasm of breast: Secondary | ICD-10-CM | POA: Diagnosis not present

## 2018-11-21 HISTORY — PX: ROBOTIC ASSISTED TOTAL HYSTERECTOMY WITH BILATERAL SALPINGO OOPHERECTOMY: SHX6086

## 2018-11-21 LAB — BASIC METABOLIC PANEL
Anion gap: 9 (ref 5–15)
BUN: 16 mg/dL (ref 6–20)
CO2: 27 mmol/L (ref 22–32)
Calcium: 9.2 mg/dL (ref 8.9–10.3)
Chloride: 103 mmol/L (ref 98–111)
Creatinine, Ser: 0.65 mg/dL (ref 0.44–1.00)
GFR calc Af Amer: >60 mL/min (ref >60)
GFR calc non Af Amer: >60 mL/min (ref >60)
Glucose, Bld: 91 mg/dL (ref 70–99)
Potassium: 3.8 mmol/L (ref 3.5–5.1)
Sodium: 139 mmol/L (ref 135–145)

## 2018-11-21 LAB — CBC
HCT: 40.1 % (ref 36.0–46.0)
Hemoglobin: 13.2 g/dL (ref 12.0–15.0)
MCH: 30.5 pg (ref 26.0–34.0)
MCHC: 32.9 g/dL (ref 30.0–36.0)
MCV: 92.6 fL (ref 80.0–100.0)
Platelets: 173 10*3/uL (ref 150–400)
RBC: 4.33 MIL/uL (ref 3.87–5.11)
RDW: 12.4 % (ref 11.5–15.5)
WBC: 5.5 10*3/uL (ref 4.0–10.5)
nRBC: 0 % (ref 0.0–0.2)

## 2018-11-21 LAB — TYPE AND SCREEN
ABO/RH(D): B POS
Antibody Screen: NEGATIVE

## 2018-11-21 SURGERY — HYSTERECTOMY, TOTAL, ROBOT-ASSISTED, LAPAROSCOPIC, WITH BILATERAL SALPINGO-OOPHORECTOMY
Anesthesia: General

## 2018-11-21 MED ORDER — SODIUM CHLORIDE FLUSH 0.9 % IV SOLN
INTRAVENOUS | Status: AC
  Filled 2018-11-21: qty 10

## 2018-11-21 MED ORDER — DEXAMETHASONE SODIUM PHOSPHATE 10 MG/ML IJ SOLN
INTRAMUSCULAR | Status: AC
  Filled 2018-11-21: qty 1

## 2018-11-21 MED ORDER — DEXAMETHASONE SODIUM PHOSPHATE 10 MG/ML IJ SOLN
INTRAMUSCULAR | Status: DC | PRN
  Administered 2018-11-21: 10 mg via INTRAVENOUS

## 2018-11-21 MED ORDER — LIDOCAINE HCL (CARDIAC) PF 100 MG/5ML IV SOSY
PREFILLED_SYRINGE | INTRAVENOUS | Status: DC | PRN
  Administered 2018-11-21: 60 mg via INTRAVENOUS

## 2018-11-21 MED ORDER — ROCURONIUM BROMIDE 100 MG/10ML IV SOLN
INTRAVENOUS | Status: DC | PRN
  Administered 2018-11-21: 30 mg via INTRAVENOUS
  Administered 2018-11-21: 50 mg via INTRAVENOUS
  Administered 2018-11-21 (×2): 20 mg via INTRAVENOUS

## 2018-11-21 MED ORDER — SUGAMMADEX SODIUM 200 MG/2ML IV SOLN
INTRAVENOUS | Status: DC | PRN
  Administered 2018-11-21: 200 mg via INTRAVENOUS

## 2018-11-21 MED ORDER — FENTANYL CITRATE (PF) 100 MCG/2ML IJ SOLN
INTRAMUSCULAR | Status: AC
  Administered 2018-11-21: 17:00:00 25 ug via INTRAVENOUS
  Filled 2018-11-21: qty 2

## 2018-11-21 MED ORDER — FENTANYL CITRATE (PF) 100 MCG/2ML IJ SOLN
25.0000 ug | INTRAMUSCULAR | Status: DC | PRN
  Administered 2018-11-21: 17:00:00 25 ug via INTRAVENOUS

## 2018-11-21 MED ORDER — OXYCODONE HCL 5 MG/5ML PO SOLN: 5.0000 mg | Freq: Once | ORAL | Status: AC | PRN

## 2018-11-21 MED ORDER — ONDANSETRON HCL 4 MG/2ML IJ SOLN
INTRAMUSCULAR | Status: AC
  Filled 2018-11-21: qty 2

## 2018-11-21 MED ORDER — IBUPROFEN 800 MG PO TABS: 800.0000 mg | ORAL_TABLET | Freq: Three times a day (TID) | ORAL | 1 refills | Status: AC

## 2018-11-21 MED ORDER — KETOROLAC TROMETHAMINE 30 MG/ML IJ SOLN
INTRAMUSCULAR | Status: DC | PRN
  Administered 2018-11-21: 30 mg via INTRAVENOUS

## 2018-11-21 MED ORDER — LACTATED RINGERS IV SOLN
INTRAVENOUS | Status: DC
  Administered 2018-11-21 (×2): via INTRAVENOUS

## 2018-11-21 MED ORDER — BUPIVACAINE HCL (PF) 0.5 % IJ SOLN
INTRAMUSCULAR | Status: AC
  Filled 2018-11-21: qty 30

## 2018-11-21 MED ORDER — LACTATED RINGERS IV SOLN: INTRAVENOUS | Status: DC

## 2018-11-21 MED ORDER — EPHEDRINE SULFATE 50 MG/ML IJ SOLN
INTRAMUSCULAR | Status: DC | PRN
  Administered 2018-11-21 (×3): 10 mg via INTRAVENOUS

## 2018-11-21 MED ORDER — FAMOTIDINE 20 MG PO TABS
20.0000 mg | ORAL_TABLET | Freq: Once | ORAL | Status: AC
  Administered 2018-11-21: 11:00:00 20 mg via ORAL

## 2018-11-21 MED ORDER — OXYCODONE HCL 5 MG PO CAPS: 5.0000 mg | ORAL_CAPSULE | Freq: Four times a day (QID) | ORAL | 0 refills | Status: DC | PRN

## 2018-11-21 MED ORDER — MIDAZOLAM HCL 2 MG/2ML IJ SOLN
INTRAMUSCULAR | Status: AC
  Filled 2018-11-21: qty 2

## 2018-11-21 MED ORDER — FENTANYL CITRATE (PF) 250 MCG/5ML IJ SOLN
INTRAMUSCULAR | Status: AC
  Filled 2018-11-21: qty 5

## 2018-11-21 MED ORDER — ACETAMINOPHEN 10 MG/ML IV SOLN
INTRAVENOUS | Status: AC
  Filled 2018-11-21: qty 100

## 2018-11-21 MED ORDER — FENTANYL CITRATE (PF) 100 MCG/2ML IJ SOLN
INTRAMUSCULAR | Status: DC | PRN
  Administered 2018-11-21 (×5): 50 ug via INTRAVENOUS

## 2018-11-21 MED ORDER — MIDAZOLAM HCL 2 MG/2ML IJ SOLN
INTRAMUSCULAR | Status: DC | PRN
  Administered 2018-11-21: 2 mg via INTRAVENOUS

## 2018-11-21 MED ORDER — DOCUSATE SODIUM 100 MG PO CAPS: 100.0000 mg | ORAL_CAPSULE | Freq: Two times a day (BID) | ORAL | 0 refills | Status: DC

## 2018-11-21 MED ORDER — PROPOFOL 10 MG/ML IV BOLUS
INTRAVENOUS | Status: DC | PRN
  Administered 2018-11-21: 150 mg via INTRAVENOUS

## 2018-11-21 MED ORDER — KETOROLAC TROMETHAMINE 30 MG/ML IJ SOLN
INTRAMUSCULAR | Status: AC
  Filled 2018-11-21: qty 1

## 2018-11-21 MED ORDER — ROCURONIUM BROMIDE 50 MG/5ML IV SOLN
INTRAVENOUS | Status: AC
  Filled 2018-11-21: qty 1

## 2018-11-21 MED ORDER — CEFAZOLIN SODIUM-DEXTROSE 2-4 GM/100ML-% IV SOLN
INTRAVENOUS | Status: AC
  Filled 2018-11-21: qty 100

## 2018-11-21 MED ORDER — OXYCODONE HCL 5 MG PO TABS
5.0000 mg | ORAL_TABLET | Freq: Once | ORAL | Status: AC | PRN
  Administered 2018-11-21: 17:00:00 5 mg via ORAL

## 2018-11-21 MED ORDER — BUPIVACAINE HCL 0.5 % IJ SOLN
INTRAMUSCULAR | Status: DC | PRN
  Administered 2018-11-21: 9 mL

## 2018-11-21 MED ORDER — PROPOFOL 10 MG/ML IV BOLUS
INTRAVENOUS | Status: AC
  Filled 2018-11-21: qty 20

## 2018-11-21 MED ORDER — ENOXAPARIN SODIUM 40 MG/0.4ML ~~LOC~~ SOLN
40.0000 mg | SUBCUTANEOUS | Status: AC
  Administered 2018-11-21: 12:00:00 40 mg via SUBCUTANEOUS
  Filled 2018-11-21 (×2): qty 0.4

## 2018-11-21 MED ORDER — GABAPENTIN 800 MG PO TABS: 800.0000 mg | ORAL_TABLET | Freq: Every day | ORAL | 0 refills | Status: DC

## 2018-11-21 MED ORDER — PHENYLEPHRINE HCL (PRESSORS) 10 MG/ML IV SOLN
INTRAVENOUS | Status: DC | PRN
  Administered 2018-11-21: 100 ug via INTRAVENOUS
  Administered 2018-11-21: 50 ug via INTRAVENOUS

## 2018-11-21 MED ORDER — GLYCOPYRROLATE 0.2 MG/ML IJ SOLN
INTRAMUSCULAR | Status: DC | PRN
  Administered 2018-11-21: 0.2 mg via INTRAVENOUS

## 2018-11-21 MED ORDER — CEFAZOLIN SODIUM-DEXTROSE 2-4 GM/100ML-% IV SOLN
2.0000 g | INTRAVENOUS | Status: AC
  Administered 2018-11-21: 2 g via INTRAVENOUS

## 2018-11-21 MED ORDER — SUGAMMADEX SODIUM 200 MG/2ML IV SOLN
INTRAVENOUS | Status: AC
  Filled 2018-11-21: qty 2

## 2018-11-21 MED ORDER — ONDANSETRON HCL 4 MG/2ML IJ SOLN
INTRAMUSCULAR | Status: DC | PRN
  Administered 2018-11-21: 4 mg via INTRAVENOUS

## 2018-11-21 MED ORDER — FAMOTIDINE 20 MG PO TABS
ORAL_TABLET | ORAL | Status: AC
  Administered 2018-11-21: 20 mg via ORAL
  Filled 2018-11-21: qty 1

## 2018-11-21 MED ORDER — ACETAMINOPHEN 500 MG PO TABS: 1000.0000 mg | ORAL_TABLET | Freq: Four times a day (QID) | ORAL | 0 refills | Status: AC

## 2018-11-21 MED ORDER — OXYCODONE HCL 5 MG PO TABS
ORAL_TABLET | ORAL | Status: AC
  Administered 2018-11-21: 17:00:00 5 mg via ORAL
  Filled 2018-11-21: qty 1

## 2018-11-21 MED ORDER — INDOCYANINE GREEN 25 MG IV SOLR
INTRAVENOUS | Status: DC | PRN
  Administered 2018-11-21: 30 mg

## 2018-11-21 MED ORDER — LIDOCAINE HCL (PF) 2 % IJ SOLN
INTRAMUSCULAR | Status: AC
  Filled 2018-11-21: qty 10

## 2018-11-21 MED ORDER — PROMETHAZINE HCL 25 MG/ML IJ SOLN: 6.2500 mg | INTRAMUSCULAR | Status: DC | PRN

## 2018-11-21 MED ORDER — EPHEDRINE SULFATE 50 MG/ML IJ SOLN
INTRAMUSCULAR | Status: AC
  Filled 2018-11-21: qty 1

## 2018-11-21 MED ORDER — ACETAMINOPHEN 10 MG/ML IV SOLN
INTRAVENOUS | Status: DC | PRN
  Administered 2018-11-21: 1000 mg via INTRAVENOUS

## 2018-11-21 SURGICAL SUPPLY — 95 items
ANCHOR TIS RET SYS 1550ML (BAG) IMPLANT
BAG URINE DRAINAGE (UROLOGICAL SUPPLIES) ×4 IMPLANT
BLADE SURG SZ11 CARB STEEL (BLADE) ×4 IMPLANT
CANISTER SUCT 1200ML W/VALVE (MISCELLANEOUS) ×4 IMPLANT
CANNULA DILATOR  5MM W/SLV (CANNULA) ×1
CANNULA DILATOR 5 W/SLV (CANNULA) ×3 IMPLANT
CANNULA SEALS 8.5MM (CANNULA) ×6
CATH FOLEY 2WAY  5CC 16FR (CATHETERS) ×2
CATH URTH 16FR FL 2W BLN LF (CATHETERS) ×2 IMPLANT
CHLORAPREP W/TINT 26 (MISCELLANEOUS) ×4 IMPLANT
CLOSURE WOUND 1/2 X4 (GAUZE/BANDAGES/DRESSINGS) ×1
CNTNR SPEC 2.5X3XGRAD LEK (MISCELLANEOUS) ×2
CONT SPEC 4OZ STER OR WHT (MISCELLANEOUS) ×2
CONTAINER SPEC 2.5X3XGRAD LEK (MISCELLANEOUS) ×2 IMPLANT
CORD BIP STRL DISP 12FT (MISCELLANEOUS) ×4 IMPLANT
CORD MONOPOLAR M/FML 12FT (MISCELLANEOUS) ×4 IMPLANT
COVER LIGHT HANDLE STERIS (MISCELLANEOUS) ×8 IMPLANT
COVER TIP SHEARS 8 DVNC (MISCELLANEOUS) ×2 IMPLANT
COVER TIP SHEARS 8MM DA VINCI (MISCELLANEOUS) ×2
COVER WAND RF STERILE (DRAPES) ×4 IMPLANT
DEFOGGER SCOPE WARMER CLEARIFY (MISCELLANEOUS) ×4 IMPLANT
DERMABOND ADVANCED (GAUZE/BANDAGES/DRESSINGS) ×2
DERMABOND ADVANCED .7 DNX12 (GAUZE/BANDAGES/DRESSINGS) ×2 IMPLANT
DRAPE 3/4 80X56 (DRAPES) ×8 IMPLANT
DRAPE ARM DVNC X/XI (DISPOSABLE) ×8 IMPLANT
DRAPE COLUMN DVNC XI (DISPOSABLE) ×2 IMPLANT
DRAPE DA VINCI XI ARM (DISPOSABLE) ×8
DRAPE DA VINCI XI COLUMN (DISPOSABLE) ×2
DRAPE LAP W/FLUID (DRAPES) ×4 IMPLANT
DRAPE LEGGINS SURG 28X43 STRL (DRAPES) ×4 IMPLANT
DRESSING SURGICEL FIBRLLR 1X2 (HEMOSTASIS) IMPLANT
DRSG SURGICEL FIBRILLAR 1X2 (HEMOSTASIS)
DRSG TELFA 3X8 NADH (GAUZE/BANDAGES/DRESSINGS) ×4 IMPLANT
ELECT REM PT RETURN 9FT ADLT (ELECTROSURGICAL) ×4
ELECTRODE REM PT RTRN 9FT ADLT (ELECTROSURGICAL) ×2 IMPLANT
FILTER LAP SMOKE EVAC STRL (MISCELLANEOUS) ×4 IMPLANT
GAUZE 4X4 16PLY RFD (DISPOSABLE) ×4 IMPLANT
GLOVE BIO SURGEON STRL SZ 6.5 (GLOVE) ×18 IMPLANT
GLOVE BIO SURGEONS STRL SZ 6.5 (GLOVE) ×6
GLOVE INDICATOR 7.0 STRL GRN (GLOVE) ×24 IMPLANT
GOWN STRL REUS W/ TWL LRG LVL3 (GOWN DISPOSABLE) ×12 IMPLANT
GOWN STRL REUS W/TWL LRG LVL3 (GOWN DISPOSABLE) ×12
GRASPER SUT TROCAR 14GX15 (MISCELLANEOUS) ×4 IMPLANT
IRRIGATION STRYKERFLOW (MISCELLANEOUS) IMPLANT
IRRIGATOR STRYKERFLOW (MISCELLANEOUS)
IV SOD CHL 0.9% 1000ML (IV SOLUTION) IMPLANT
KIT IMAGING PINPOINTPAQ (MISCELLANEOUS) ×4 IMPLANT
KIT PINK PAD W/HEAD ARE REST (MISCELLANEOUS) ×4
KIT PINK PAD W/HEAD ARM REST (MISCELLANEOUS) ×2 IMPLANT
KIT TURNOVER CYSTO (KITS) ×4 IMPLANT
LABEL OR SOLS (LABEL) ×4 IMPLANT
MANIPULATOR VCARE LG CRV RETR (MISCELLANEOUS) IMPLANT
MANIPULATOR VCARE SML CRV RETR (MISCELLANEOUS) IMPLANT
MANIPULATOR VCARE STD CRV RETR (MISCELLANEOUS) IMPLANT
NDL INSUFF ACCESS 14 VERSASTEP (NEEDLE) ×4 IMPLANT
NEEDLE HYPO 22GX1.5 SAFETY (NEEDLE) ×4 IMPLANT
NEEDLE SPNL 22GX3.5 QUINCKE BK (NEEDLE) ×4 IMPLANT
NEEDLE SPNL 22GX5 LNG QUINC BK (NEEDLE) ×4 IMPLANT
NS IRRIG 1000ML POUR BTL (IV SOLUTION) ×4 IMPLANT
NS IRRIG 500ML POUR BTL (IV SOLUTION) ×4 IMPLANT
OBTURATOR OPTICAL STANDARD 8MM (TROCAR)
OBTURATOR OPTICAL STND 8 DVNC (TROCAR)
OBTURATOR OPTICALSTD 8 DVNC (TROCAR) IMPLANT
OCCLUDER COLPOPNEUMO (BALLOONS) ×4 IMPLANT
PACK LAP CHOLECYSTECTOMY (MISCELLANEOUS) ×4 IMPLANT
PAD OB MATERNITY 4.3X12.25 (PERSONAL CARE ITEMS) ×4 IMPLANT
PAD PREP 24X41 OB/GYN DISP (PERSONAL CARE ITEMS) ×4 IMPLANT
PENCIL ELECTRO HAND CTR (MISCELLANEOUS) ×4 IMPLANT
PORT ACCESS TROCAR AIRSEAL 5 (TROCAR) IMPLANT
SEAL CANN 8.5 DVNC (CANNULA) ×6 IMPLANT
SEAL CANN UNIV 5-8 DVNC XI (MISCELLANEOUS) ×6 IMPLANT
SEAL XI 5MM-8MM UNIVERSAL (MISCELLANEOUS) ×6
SEALER VESSEL DA VINCI XI (MISCELLANEOUS) ×2
SEALER VESSEL EXT DVNC XI (MISCELLANEOUS) ×2 IMPLANT
SET CYSTO W/LG BORE CLAMP LF (SET/KITS/TRAYS/PACK) IMPLANT
SET TRI-LUMEN FLTR TB AIRSEAL (TUBING) ×4 IMPLANT
SLEEVE VERSASTEP EXPAND ONEST (MISCELLANEOUS) ×8 IMPLANT
SOLUTION ELECTROLUBE (MISCELLANEOUS) ×4 IMPLANT
STRIP CLOSURE SKIN 1/2X4 (GAUZE/BANDAGES/DRESSINGS) ×3 IMPLANT
SUT DVC VLOC 180 0 12IN GS21 (SUTURE) ×4
SUT MNCRL 4-0 (SUTURE) ×2
SUT MNCRL 4-0 27XMFL (SUTURE) ×2
SUT VIC AB 0 CT2 27 (SUTURE) ×4 IMPLANT
SUT VIC AB 1 CT1 36 (SUTURE) ×4 IMPLANT
SUT VICRYL 0 AB UR-6 (SUTURE) IMPLANT
SUT VICRYL 0 UR6 27IN ABS (SUTURE) ×8 IMPLANT
SUTURE DVC VLC 180 0 12IN GS21 (SUTURE) ×2 IMPLANT
SUTURE MNCRL 4-0 27XMF (SUTURE) ×2 IMPLANT
SYR 3ML LL SCALE MARK (SYRINGE) ×8 IMPLANT
SYR 50ML LL SCALE MARK (SYRINGE) ×8 IMPLANT
TAPE TRANSPORE STRL 2 31045 (GAUZE/BANDAGES/DRESSINGS) ×4 IMPLANT
TROCAR BALLN GELPORT 12X130M (ENDOMECHANICALS) IMPLANT
TROCAR BLUNT TIP 12MM OMST12BT (TROCAR) IMPLANT
TROCAR SL VERSASTEP 5M LG  B (MISCELLANEOUS) ×2
TROCAR SL VERSASTEP 5M LG B (MISCELLANEOUS) ×2 IMPLANT

## 2018-11-21 NOTE — H&P (Signed)
Chief Complaint:    Patient ID: .April Schrift [place]  48 y.o. F presenting with persistent postmenopausal bleeding  HPI: Prior ablation with no ability to sample endometrial lining, and three episodes of PMB this year.  Past Medical History:  has a past medical history of Frozen shoulder and Pre-diabetes.  Past Surgical History:  Past Surgical History:  Procedure Laterality Date  . BREAST BIOPSY Left 01/17/2005   bx/u/s-neg  . CESAREAN SECTION    . DILATION AND CURETTAGE OF UTERUS    . HERNIA REPAIR Right    right inguinal  . HYSTEROSCOPY W/D&C N/A 10/19/2018   Procedure: DILATATION AND CURETTAGE /HYSTEROSCOPY;  Surgeon: [name], MD;  Location: [facility] ORS;  Service: Gynecology;  Laterality: N/A;  . RETINAL DETACHMENT SURGERY Left     Family History: family history includes Breast cancer (age of onset: 83) in her mother.  Social History:  reports that she has never smoked. She has never used smokeless tobacco. She reports that she does not drink alcohol or use drugs.  OB/GYN History:  OB History   No obstetric history on file.     Allergies: is allergic to sulfa antibiotics.  Medications: No current facility-administered medications for this encounter.   Current Outpatient Medications:  Marland Kitchen  Multiple Vitamin (MULTIVITAMIN PO), Take 2 tablets by mouth daily., Disp: , Rfl:  .  psyllium (METAMUCIL) 58.6 % packet, Take 0.5 packets by mouth daily., Disp: , Rfl:  .  vitamin B-12 (CYANOCOBALAMIN) 1000 MCG tablet, Take 1,000 mcg by mouth daily. , Disp: , Rfl:  .  ibuprofen (ADVIL) 800 MG tablet, Take 1 tablet (800 mg total) by mouth every 8 (eight) hours as needed for moderate pain or cramping. (Patient not taking: Reported on 11/15/2018), Disp: 30 tablet, Rfl: 1  Review of Systems: No SOB, no palpitations or chest pain, no new lower extremity edema, no nausea or vomiting or bowel or bladder complaints. See HPI for gyn specific ROS.   Exam:  There were no vitals  taken for this visit.   General: Patient is well-groomed, well-nourished, appears stated age in no acute distress   HEENT: head is atraumatic and normocephalic, trachea is midline, neck is supple with no palpable nodules   CV: Regular rhythm and normal heart rate, no murmur   Pulm: Clear to auscultation throughout lung fields with no wheezing, crackles, or rhonchi. No increased work of breathing  Abdomen: soft , no mass, non-tender, no rebound tenderness, no hepatomegaly  Pelvic: deferred    Impression:   Persistent PMB    Plan:   Patient returns for a preoperative discussion regarding her plans to proceed with definitive surgical treatment of her pMB by  robotic assisted total laparoscopic hysterectomy with bilateral salpingectomy with lymph node mapping.  Dr. Theora Gianotti will perform the lymph node mapping  The patient and I discussed the technical aspects of the procedure including the potential for risks and complications.  These include but are not limited to the risk of infection requiring post-operative antibiotics or further procedures.  We talked about the risk of injury to adjacent organs including bladder, bowel, ureter, blood vessels or nerves.  We talked about the need to convert to an open incision.  We talked about the possible need for blood transfusion.  We talked about postop complications such as thromboembolic or cardiopulmonary complications.  All of her questions were answered.  Her preoperative exam was completed and the appropriate consents were signed. She is scheduled to undergo this procedure in the  near future.

## 2018-11-21 NOTE — Op Note (Signed)
Operative Note   SURGERY DATE: 11/21/2018  PRE-OP DIAGNOSIS:  1. Persistent postmenopausal bleeding without ability to sample the endometrium   POST-OP DIAGNOSIS:  1. Same 2. Frozen section benign  PROCEDURES: 1. Exam under anesthesia 2. ROBOT ASSISTED LAPAROSCOPY, SURGICAL, WITH TOTAL HYSTERECTOMY, FOR UTERUS 250 G OR LESS; WITH REMOVAL OF TUBE(S) AND / OR OVARY(S) 3. Pelvic lymph node mapping  SURGEON: Angelina Pih, MD, MPH  ASSISTANT(S):  Larey Days, MD Santiago Glad, MS  STAFF -  Circulator: Gustavo Lah, RN, BSN Scrub Tech: Guy Franco , [place]  ANESTHESIA: General   ESTIMATED BLOOD LOSS: 25 mL  DRAINS: Foley catheter  TOTAL IV FLUIDS: 123456 mL  COMPLICATIONS: None  DISPOSITION: PACU - hemodynamically stable.  CONDITION: stable  Antibiotics: Ancef 2g iv within 1 hour prior to start of procedure  VTE prophylaxis: was ordered perioperatively.  Wound: Clean contaminated  IMPLANTS: None  INDICATIONS: Persistent postmenopausal bleeding without ability to sample the endometrium  OPERATIVE FINDINGS:  Pelvic: External genitalia negative for lesions. Vagina negative. Adenxa negative for masses or nodularity. Cervix without gross lesions. Uterus mobile, anteverted, small.  Intraoperative findings revealed a normal upper abdomen including bowel, diaphragmatic surfaces, stomach, and omentum.  The uterus was small and mobile.  The right and left ovaries appeared normal.  Bilateral tubes appeared normal.   PROCEDURE IN DETAIL: After informed consent was obtained, the patient was taken to the operating room where general anesthesia was obtained without difficulty. The patient was positioned in the dorsal lithotomy position in [place] and her arms were carefully tucked at her sides and the usual precautions were taken.  Preoperative prophylactic antibiotics were given through her iv.  She was prepped and draped in normal sterile fashion.  Time-out was  performed and a Foley catheter was placed into the bladder. 3 ml of Indocyanine Green was injected directly into the cervix at both 3 and 9 o'clock. A standard VCare uterine manipulator was then placed in the uterus without incident.    After infiltration of local anesthetic at the proposed trocar sites, an 8 mm incision was created supraumbilically. A Veress needle was carefully introduced with the gas on low-flow, and entry into the peritoneal cavity was assured with a low pressure of 33mmHg noted. Pneumoperitoneum was created to a pressure of 15 mm Hg. The camera port was placed and the abdomen surveyed, noting intact bowel below the site of entry. A survey of the pelvis and upper abdomen revealed the above findings. Right and left lateral 8-mm robotic ports and a 5 mm left upper quadrant assistant port were placed under direct visualization.  The patient was placed in deepTrendelenburg and the bowel was displaced up into the upper abdomen. The robot was left side docked. The instruments were placed under direct visualization.   Round ligaments were divided on each side with the Endoshears and the retroperitoneal space was opened bilaterally. The posterior leaflet of the broad was taken down to the level of the IP ligament. Right sided sentinel lymph node mapping was undertaken by Dr. Theora Gianotti and left sided by Dr. Leonides Schanz. The ureters were identified bilaterally coursing outside of the operative field. The anterior leaflet of the broad ligament was carefully taken down to the midline.  A bladder flap was created and the bladder was dissected down off the lower uterine segment and cervix using endoshears and electrocautery.   The Fallopian tubes were divided from the ovaries, and care taken to hemostatically transect the utero-ovarian ligament. The peritoneum was taken  down to the level of the internal os, and the uterine arteries skeletonized. With strong cephalad pressure from the V-care, bipolar cautery was  used to seal and transect the uterine arteries, and the pedicles allowed to fall away laterally.  A colpotomy was performed circumferentially along the V-Care ring with monopolar electrocautery and the cervix was incised from the vagina using the laparoscopic scissors.  Bleeding at the left lateral cuff angle was controlled with the vessel sealer, and the ureter was noted to be lateral and inferior to the area of cautery. Ureteral peristalsis was noted.  The specimen was removed through the vagina.  A pneumo balloon was placed in the vagina and the vaginal cuff was then closed in a running continuous fashion using the 0 V-Lock suture which was placed vaginally into the peritoneum under direct visualization with careful attention to include the vaginal cuff angles and the vaginal mucosa within the closure.  Hemostasis was secured with suction-irrigation.The intraperitoneal pressure was dropped, and all planes of dissection, vascular pedicles and the vaginal cuff were found to be hemostatic.  The robot was undocked.  The lateral trocars were removed under visualization.  The CO2 gas was released and several deep breaths given to remove any remaining CO2 from the peritoneal cavity.  The skin incisions were closed with 4-0 Monocryl subcuticular stitch and pressure dressings placed.    Anesthesia was reversed without difficulty.  The patient tolerated the procedure well.  Sponge, lap and needle counts were correct x2.  The patient was taken to recovery room in excellent condition.

## 2018-11-21 NOTE — Anesthesia Postprocedure Evaluation (Signed)
Anesthesia Post Note  Patient: Art therapist  Procedure(s) Performed: XI ROBOTIC ASSISTED TOTAL LAPAROSCOPIC HYSTERECTOMY WITH BILATERAL SALPINGECTOMY (Bilateral ) INDOCYANINE GREEN FLUORESCENCE IMAGING (ICG) (N/A )  Patient location during evaluation: PACU Anesthesia Type: General Level of consciousness: awake and alert Pain management: pain level controlled Vital Signs Assessment: post-procedure vital signs reviewed and stable Respiratory status: spontaneous breathing, nonlabored ventilation, respiratory function stable and patient connected to nasal cannula oxygen Cardiovascular status: blood pressure returned to baseline and stable Postop Assessment: no apparent nausea or vomiting Anesthetic complications: no     Last Vitals:  Vitals:   11/21/18 1708 11/21/18 1806  BP: 116/64 (!) 122/47  Pulse: 66 63  Resp: 14 18  Temp: (!) 36.1 C   SpO2: 99% 100%    Last Pain:  Vitals:   11/21/18 1708  TempSrc: Temporal  PainSc: [place]

## 2018-11-21 NOTE — Discharge Instructions (Signed)
AMBULATORY SURGERY  DISCHARGE INSTRUCTIONS   1) The drugs that you were given will stay in your system until tomorrow so for the next 24 hours you should not:  A) Drive an automobile B) Make any legal decisions C) Drink any alcoholic beverage   2) You may resume regular meals tomorrow.  Today it is better to start with liquids and gradually work up to solid foods.  You may eat anything you prefer, but it is better to start with liquids, then soup and crackers, and gradually work up to solid foods.   3) Please notify your doctor immediately if you have any unusual bleeding, trouble breathing, redness and pain at the surgery site, drainage, fever, or pain not relieved by medication.   4) Additional Instructions:     Please contact your physician with any problems or Same Day Surgery at [phone], Monday through Friday 6 am to 4 pm, or [place] at [facility] number at [phone].Discharge instructions after  robotically-assisted total laparoscopic hysterectomy   For the next three days, take ibuprofen and acetaminophen on a schedule, every 8 hours. You can take them together or you can intersperse them, and take one every four hours. I also gave you gabapentin for nighttime, to help you sleep and also to control pain. Take gabapentin medicines at night for at least the next 3 nights. You also have a narcotic, oxycodone, to take as needed if the above medicines don't help.  Postop constipation is a major cause of pain. Stay well hydrated, walk as you tolerate, and take over the counter senna as well as stool softeners if you need them.    Signs and Symptoms to Report Call our office at [phone] if you have any of the following.   Fever over 100.4 degrees or higher  Severe stomach pain not relieved with pain medications  Bright red bleeding thats heavier than a period that does not slow with rest  To go the bathroom a lot (frequency), you cant hold your urine  (urgency), or it hurts when you empty your bladder (urinate)  Chest pain  Shortness of breath  Pain in the calves of your legs  Severe nausea and vomiting not relieved with anti-nausea medications  Signs of infection around your wounds, such as redness, hot to touch, swelling, green/yellow drainage (like pus), bad smelling discharge  Any concerns  What You Can Expect after Surgery  You may see some pink tinged, bloody fluid and bruising around the wound. This is normal.  You may notice shoulder and neck pain. This is caused by the gas used during surgery to expand your abdomen so your surgeon could get to the uterus easier.  You may have a sore throat because of the tube in your mouth during general anesthesia. This will go away in 2 to 3 days.  You may have some stomach cramps.  You may notice spotting on your panties.  You may have pain around the incision sites.   Activities after Your Discharge Follow these guidelines to help speed your recovery at home:  Do the coughing and deep breathing as you did in the hospital for 2 weeks. Use the small blue breathing device, called the incentive spirometer for 2 weeks.  Dont drive if you are in pain or taking narcotic pain medicine. You may drive when you can safely slam on the brakes, turn the wheel forcefully, and rotate your torso comfortably. This is typically 1-2 weeks. Practice in a parking lot or  side street prior to attempting to drive regularly.   Ask others to help with household chores for 4 weeks.  Do not lift anything heavier that 10 pounds for 4-6 weeks. This includes pets, children, and groceries.  Dont do strenuous activities, exercises, or sports like vacuuming, tennis, squash, etc. until your doctor says it is safe to do so. ---Maintain pelvic rest for 12 weeks. This means nothing in the vagina or rectum at all (no douching, tampons, intercourse) for 12 weeks.   Walk as you feel able. Rest often since it may  take two or three weeks for your energy level to return to normal.   You may climb stairs  Avoid constipation:   -Eat fruits, vegetables, and whole grains. Eat small meals as your appetite will take time to return to normal.   -Drink 6 to 8 glasses of water each day unless your doctor has told you to limit your fluids.   -Use a laxative or stool softener as needed if constipation becomes a problem. You may take Miralax, metamucil, Citrucil, Colace, Senekot, FiberCon, etc. If this does not relieve the constipation, try two tablespoons of Milk Of Magnesia every 8 hours until your bowels move.   You may shower. Gently wash the wounds with a mild soap and water. Pat dry.  Do not get in a hot tub, swimming pool, etc. for 6 weeks.  Do not use lotions, oils, powders on the wounds.  Do not douche, use tampons, or have sex until your doctor says it is okay.  Take your pain medicine when you need it. The medicine may not work as well if the pain is bad.  Take the medicines you were taking before surgery. Other medications you will need are pain medications (Norco or Percocet) and nausea medications (Zofran).

## 2018-11-21 NOTE — Anesthesia Post-op Follow-up Note (Signed)
Anesthesia QCDR form completed.        

## 2018-11-21 NOTE — Progress Notes (Signed)
Patient attempted to urinate. Unsuccessful. Will try again.

## 2018-11-21 NOTE — Interval H&P Note (Signed)
History and Physical Interval Note:  11/21/2018 11:25 AM  April Higgins  has presented today for surgery, with the diagnosis of postmenopausal bleeding.  The various methods of treatment have been discussed with the patient and family. After consideration of risks, benefits and other options for treatment, the patient has consented to  Procedure(s): XI ROBOTIC ASSISTED TOTAL LAPAROSCOPIC HYSTERECTOMY WITH BILATERAL SALPINGECTOMY (Bilateral) XI ROBOTIC PELVIC AND SENTINEL LYMPH NODE MAPPING AND BIOPSIES (Bilateral) INDOCYANINE GREEN FLUORESCENCE IMAGING (ICG) (N/A) as a surgical intervention.  The patient's history has been reviewed, patient examined, no change in status, stable for surgery.  I have reviewed the patient's chart and labs.  Questions were answered to the patient's satisfaction.     April Higgins

## 2018-11-21 NOTE — Anesthesia Preprocedure Evaluation (Addendum)
Anesthesia Evaluation  Patient identified by MRN, date of birth, ID band Patient awake    Reviewed: Allergy & Precautions, H&P , NPO status , Patient's Chart, lab work & pertinent test results  History of Anesthesia Complications Negative for: history of anesthetic complications  Airway Mallampati: II  TM Distance: >3 FB Neck ROM: full    Dental   Pulmonary neg pulmonary ROS, neg shortness of breath, neg COPD, neg recent URI, Not current smoker,           Cardiovascular (-) hypertension(-) angina(-) Past MI and (-) Cardiac Stents negative cardio ROS  (-) dysrhythmias      Neuro/Psych negative neurological ROS  negative psych ROS   GI/Hepatic negative GI ROS, Neg liver ROS,   Endo/Other  negative endocrine ROS  Renal/GU      Musculoskeletal   Abdominal   Peds  Hematology negative hematology ROS (+)   Anesthesia Other Findings Past Medical History: No date: Frozen shoulder     Comment:  right No date: Pre-diabetes  Past Surgical History: 01/17/2005: BREAST BIOPSY; Left     Comment:  bx/u/s-neg No date: CESAREAN SECTION No date: DILATION AND CURETTAGE OF UTERUS No date: HERNIA REPAIR; Right     Comment:  right inguinal 10/19/2018: HYSTEROSCOPY W/D&C; N/A     Comment:  Procedure: DILATATION AND CURETTAGE /HYSTEROSCOPY;                Surgeon: [name], MD;  Location: [facility];                Service: Gynecology;  Laterality: N/A; No date: RETINAL DETACHMENT SURGERY; Left     Reproductive/Obstetrics negative OB ROS                            Anesthesia Physical Anesthesia Plan  ASA: II  Anesthesia Plan: General ETT   Post-op Pain Management:    Induction:   PONV Risk Score and Plan: Ondansetron, Dexamethasone, Midazolam and Treatment may vary due to age or medical condition  Airway Management Planned:   Additional Equipment:   Intra-op Plan:   Post-operative  Plan:   Informed Consent: I have reviewed the patients History and Physical, chart, labs and discussed the procedure including the risks, benefits and alternatives for the proposed anesthesia with the patient or authorized representative who has indicated his/her understanding and acceptance.     Dental Advisory Given  Plan Discussed with: Anesthesiologist, CRNA and Surgeon  Anesthesia Plan Comments:         Anesthesia Quick Evaluation

## 2018-11-21 NOTE — Anesthesia Procedure Notes (Signed)
Procedure Name: Intubation Date/Time: 11/21/2018 12:26 PM Performed by: Jerrye Noble, CRNA Pre-anesthesia Checklist: Patient identified, Emergency Drugs available, Suction available, Patient being monitored and Timeout performed Patient Re-evaluated:Patient Re-evaluated prior to induction Oxygen Delivery Method: Circle system utilized Preoxygenation: Pre-oxygenation with 100% oxygen Induction Type: IV induction Ventilation: Mask ventilation without difficulty Laryngoscope Size: Mac and 3 Grade View: Grade I Tube size: 7.0 mm Number of attempts: 1 Airway Equipment and Method: Stylet Placement Confirmation: ETT inserted through vocal cords under direct vision,  positive ETCO2 and breath sounds checked- equal and bilateral Secured at: 21 cm Tube secured with: Tape Dental Injury: Teeth and Oropharynx as per pre-operative assessment

## 2018-11-21 NOTE — Transfer of Care (Signed)
Immediate Anesthesia Transfer of Care Note  Patient: April Higgins  Procedure(s) Performed: XI ROBOTIC ASSISTED TOTAL LAPAROSCOPIC HYSTERECTOMY WITH BILATERAL SALPINGECTOMY (Bilateral ) INDOCYANINE GREEN FLUORESCENCE IMAGING (ICG) (N/A )  Patient Location: PACU  Anesthesia Type:General  Level of Consciousness: awake and drowsy  Airway & Oxygen Therapy: Patient Spontanous Breathing and Patient connected to face mask oxygen  Post-op Assessment: Report given to RN and Post -op Vital signs reviewed and stable  Post vital signs: stable  Last Vitals:  Vitals Value Taken Time  BP 111/98 11/21/18 1548  Temp    Pulse 62 11/21/18 1551  Resp 16 11/21/18 1551  SpO2 100 % 11/21/18 1551  Vitals shown include unvalidated device data.  Last Pain:  Vitals:   11/21/18 1106  TempSrc: Temporal  PainSc: 0-No pain         Complications: No apparent anesthesia complications

## 2018-11-22 ENCOUNTER — Encounter: Payer: Self-pay | Admitting: Obstetrics & Gynecology

## 2018-11-23 LAB — CYTOLOGY - NON PAP

## 2018-11-23 LAB — SURGICAL PATHOLOGY

## 2018-11-27 HISTORY — PX: ABDOMINAL HYSTERECTOMY: SHX81

## 2019-01-11 ENCOUNTER — Other Ambulatory Visit: Payer: Self-pay | Admitting: Obstetrics and Gynecology

## 2019-01-11 DIAGNOSIS — Z1231 Encounter for screening mammogram for malignant neoplasm of breast: Secondary | ICD-10-CM

## 2019-01-30 ENCOUNTER — Ambulatory Visit
Admission: RE | Admit: 2019-01-30 | Discharge: 2019-01-30 | Disposition: A | Discharge status: Home / Self Care | Payer: BC Managed Care – PPO | Source: Ambulatory Visit | Attending: Obstetrics and Gynecology | Admitting: Obstetrics and Gynecology

## 2019-01-30 ENCOUNTER — Encounter (INDEPENDENT_AMBULATORY_CARE_PROVIDER_SITE_OTHER): Payer: Self-pay

## 2019-01-30 ENCOUNTER — Other Ambulatory Visit: Payer: Self-pay

## 2019-01-30 DIAGNOSIS — Z1231 Encounter for screening mammogram for malignant neoplasm of breast: Secondary | ICD-10-CM | POA: Diagnosis not present

## 2020-03-13 IMAGING — MG DIGITAL SCREENING BILAT W/ TOMO W/ CAD
8 series · 8 of 24 positions shown · non-contrast
Comparison: Previous exam(s).

CLINICAL DATA: Screening.

EXAM:
DIGITAL SCREENING BILATERAL MAMMOGRAM WITH TOMO AND CAD

[R MLO synth-2D]
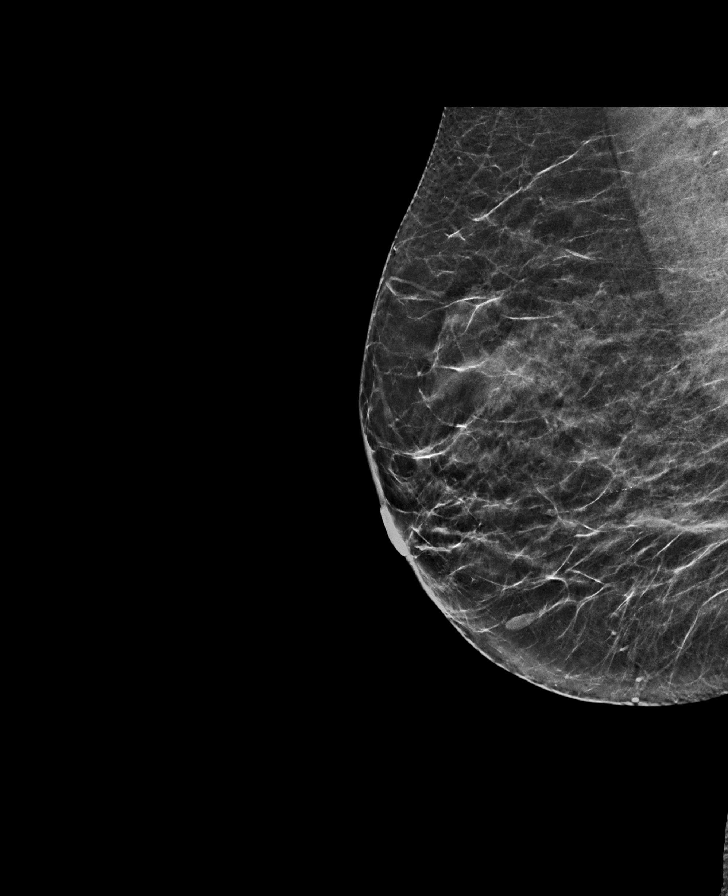

[R CC synth-2D]
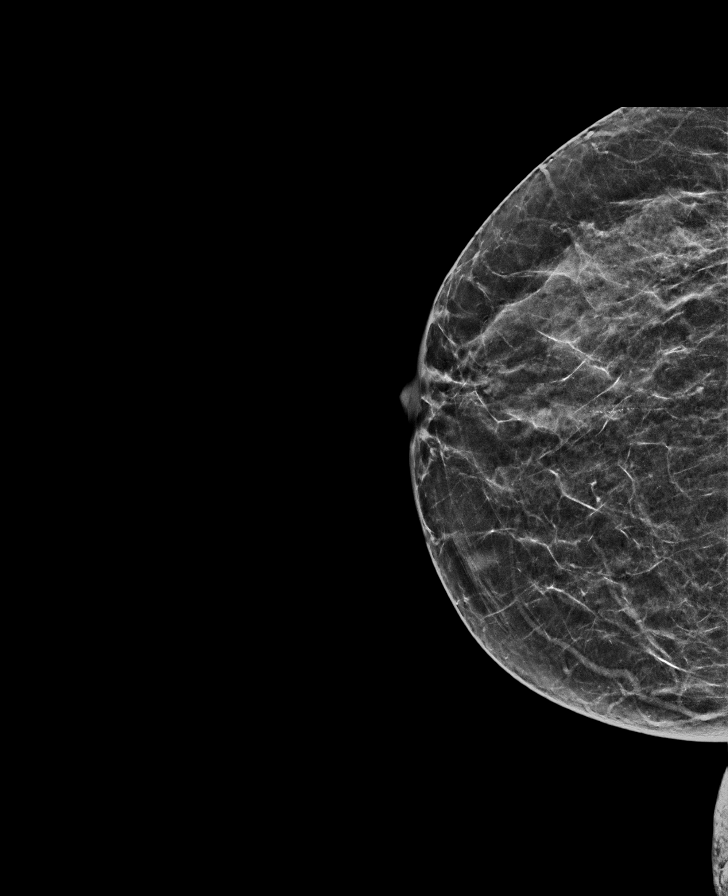

[L MLO synth-2D]
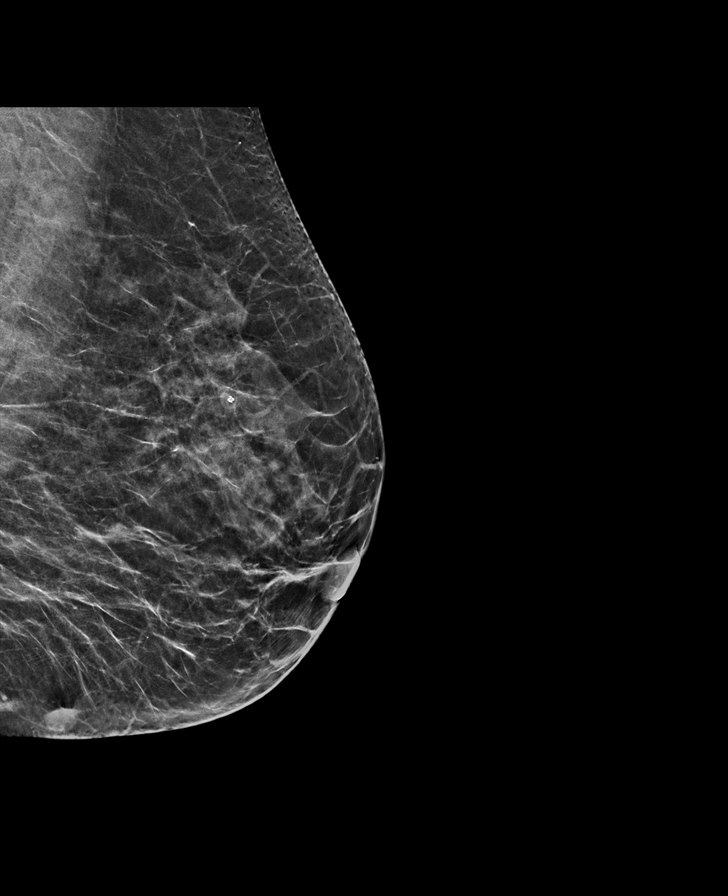

[L CC synth-2D]
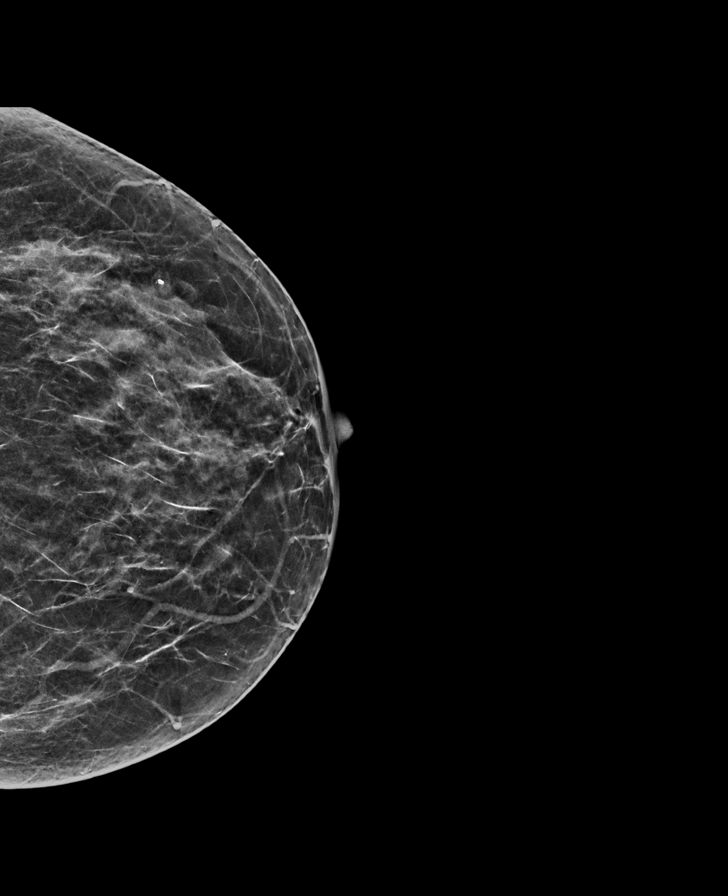

[R CC tomo · tomo slice 28/55.0]
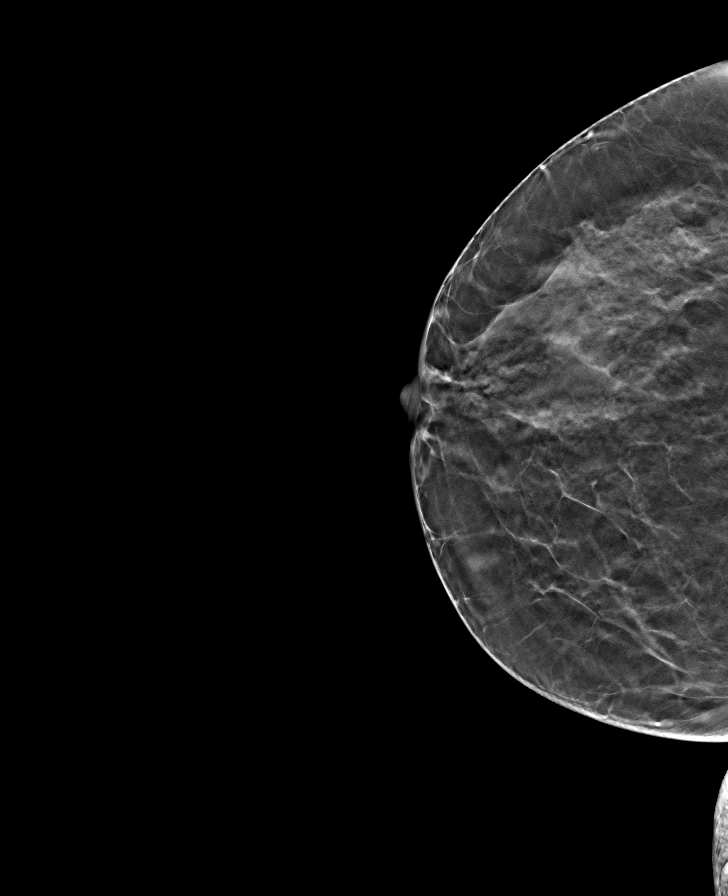

[L CC tomo · tomo slice 27/54.0]
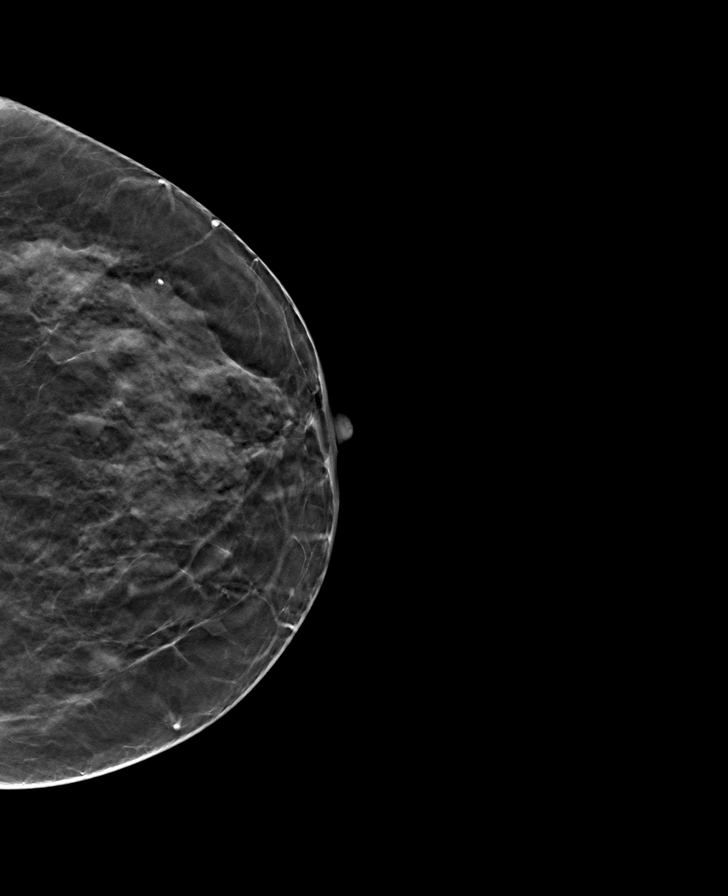

[L MLO tomo · tomo slice 31/62.0]
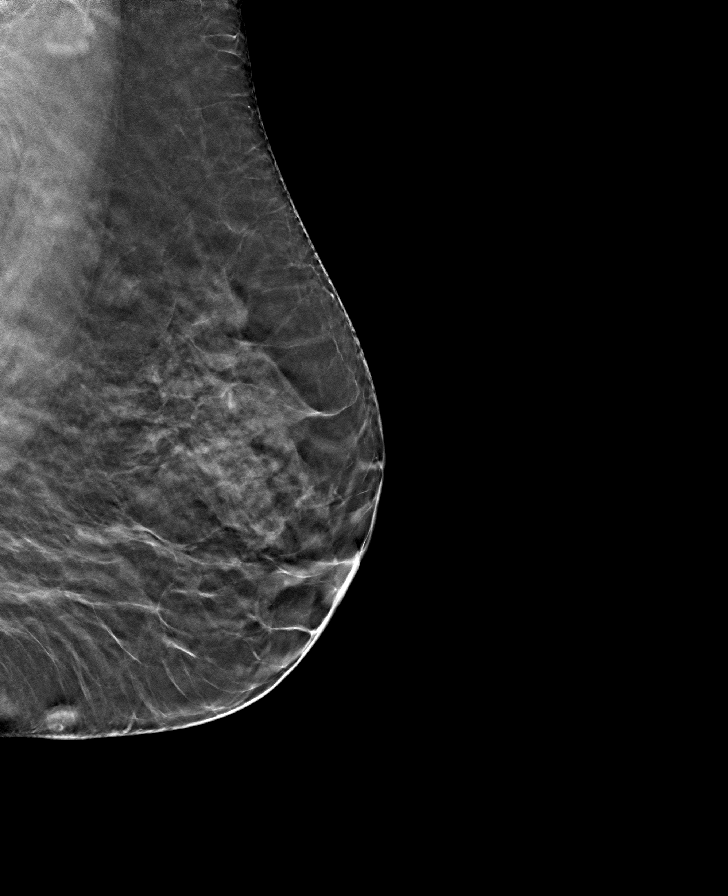

[R MLO tomo · tomo slice 31/61.0]
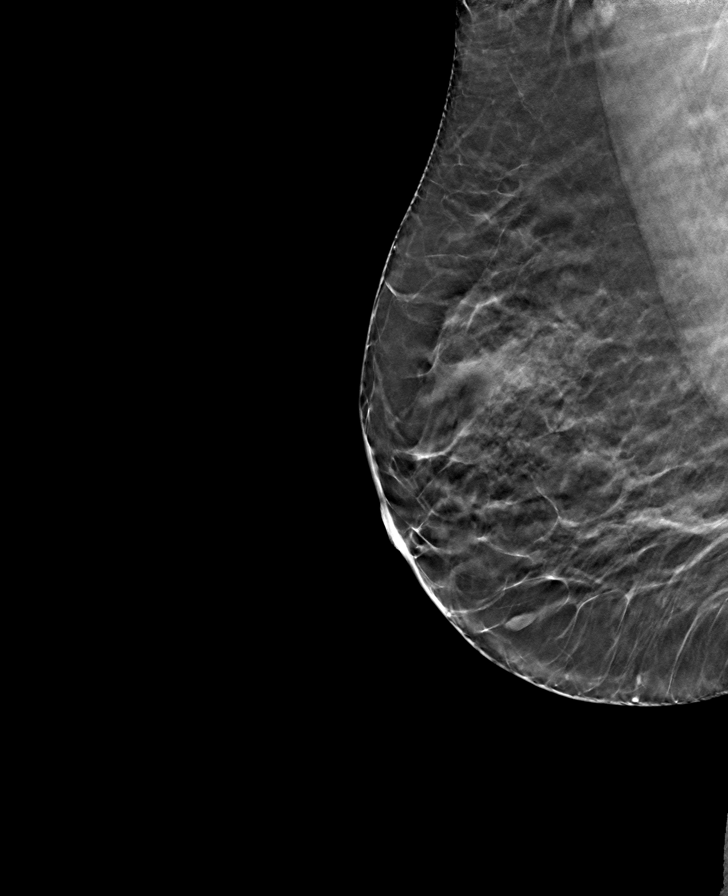

[8 of 24 positions shown; findings below may reference images not displayed]

ACR Breast Density Category c: The breast tissue is heterogeneously
dense, which may obscure small masses.
FINDINGS: There are no findings suspicious for malignancy. Images were
processed with CAD.
IMPRESSION: No mammographic evidence of malignancy. A result letter of this
screening mammogram will be mailed directly to the patient.

RECOMMENDATION:
Screening mammogram in one year. (Code:[id number]-U-LHB)

BI-RADS CATEGORY  1: Negative.

## 2021-08-29 ENCOUNTER — Ambulatory Visit
Admission: EM | Admit: 2021-08-29 | Discharge: 2021-08-29 | Disposition: A | Discharge status: Home / Self Care | Payer: BC Managed Care – PPO | Attending: Emergency Medicine | Admitting: Emergency Medicine

## 2021-08-29 ENCOUNTER — Encounter: Payer: Self-pay | Admitting: Emergency Medicine

## 2021-08-29 ENCOUNTER — Ambulatory Visit (INDEPENDENT_AMBULATORY_CARE_PROVIDER_SITE_OTHER): Payer: BC Managed Care – PPO

## 2021-08-29 DIAGNOSIS — M25572 Pain in left ankle and joints of left foot: Secondary | ICD-10-CM

## 2021-08-29 DIAGNOSIS — W19XXXA Unspecified fall, initial encounter: Secondary | ICD-10-CM | POA: Diagnosis not present

## 2021-08-29 DIAGNOSIS — S82832A Other fracture of upper and lower end of left fibula, initial encounter for closed fracture: Secondary | ICD-10-CM | POA: Diagnosis not present

## 2021-08-29 DIAGNOSIS — S93402A Sprain of unspecified ligament of left ankle, initial encounter: Secondary | ICD-10-CM

## 2021-08-29 MED ORDER — IBUPROFEN 600 MG PO TABS: 600.0000 mg | ORAL_TABLET | Freq: Four times a day (QID) | ORAL | 0 refills | Status: AC | PRN

## 2021-08-29 NOTE — ED Provider Notes (Signed)
HPI  SUBJECTIVE:  April Higgins is a 51 y.o. female who presents with left lateral ankle pain, swelling, bruising after rolling her ankle outward while walking.  Patient states she rolled it outward again several days later.  She reports limitation of motion at the ankle secondary to pain and edema, numbness/tingling in the foot.  She describes the pain as sharp with walking and like pins-and-needles at night.  She denies injury to the foot.  She has tried rest, elevation, ice, Advil, a Velcro ankle brace, compression sleeve.  The ankle brace and rest help.  Symptoms worse with weightbearing, plantarflexion, inversion/eversion.  She is not sure if she has ever hurt this ankle before.  PCP: Angelene Giovanni    Past Medical History:  Diagnosis Date   Frozen shoulder    right   Pre-diabetes     Past Surgical History:  Procedure Laterality Date   ABDOMINAL HYSTERECTOMY  11/27/2018   BREAST BIOPSY Left 01/17/2005   bx/u/s-neg   CESAREAN SECTION     DILATION AND CURETTAGE OF UTERUS     HERNIA REPAIR Right    right inguinal   HYSTEROSCOPY WITH D & C N/A 10/19/2018   Procedure: DILATATION AND CURETTAGE Pollyann Glen;  Surgeon: [name], MD;  Location: [facility];  Service: Gynecology;  Laterality: N/A;   RETINAL DETACHMENT SURGERY Left    ROBOTIC ASSISTED TOTAL HYSTERECTOMY WITH BILATERAL SALPINGO OOPHERECTOMY Bilateral 11/21/2018   Procedure: XI ROBOTIC ASSISTED TOTAL LAPAROSCOPIC HYSTERECTOMY WITH BILATERAL SALPINGECTOMY;  Surgeon: Ward, Honor Loh, MD;  Location: [facility];  Service: Gynecology;  Laterality: Bilateral;    Family History  Problem Relation Age of Onset   Breast cancer Mother 106    Social History   Tobacco Use   Smoking status: Never   Smokeless tobacco: Never  Vaping Use   Vaping Use: Never used  Substance Use Topics   Alcohol use: Never   Drug use: Never    No current facility-administered medications for this encounter.  Current Outpatient  Medications:    ibuprofen (ADVIL) 600 MG tablet, Take 1 tablet (600 mg total) by mouth every 6 (six) hours as needed., Disp: 30 tablet, Rfl: 0   Multiple Vitamin (MULTIVITAMIN PO), Take 2 tablets by mouth daily., Disp: , Rfl:    psyllium (METAMUCIL) 58.6 % packet, Take 0.5 packets by mouth daily., Disp: , Rfl:   Allergies  Allergen Reactions   Sulfa Antibiotics Palpitations and Other (See Comments)    Sever headache     ROS  As noted in HPI.   Physical Exam  BP 130/90 (BP Location: Left Arm)   Pulse 70   Temp 98.3 F (36.8 C) (Oral)   Resp 14   Ht '5\' 8"'$  (1.727 m)   Wt 81.9 kg   SpO2 98%   BMI 27.45 kg/m   Constitutional: Well developed, well nourished, no acute distress Eyes:  EOMI, conjunctiva normal bilaterally HENT: Normocephalic, atraumatic,mucus membranes moist Respiratory: Normal inspiratory effort Cardiovascular: Normal rate GI: nondistended skin: No rash, skin intact Musculoskeletal: L Ankle: Proximal fibula NT, Distal fibula tender with overlying soft tissue swelling, Medial malleolus NT,  Deltoid ligaments medially NT ,  ATFL tender, calcaneofibular ligament NT , posterior tablofibular ligament tender,  Achilles NT, calcaneus NT,  Proximal 5th metatarsal NT, Midfoot NT, distal NVI with baseline sensation / motor to foot with DP 2+.  Pain with plantarflexion, inversion/eversion.  No pain with dorsiflexion.  Positive bruising over the lateral distal fibula.  Negative squeeze test.  Anterior  drawer stable.  Patient able to bear weight in department.  Mild swelling of the foot.   Neurologic: Alert & oriented x 3, no focal neuro deficits Psychiatric: Speech and behavior appropriate   ED Course   Medications - No data to display  Orders Placed This Encounter  Procedures   DG Ankle Complete Left    Standing Status:   Standing    Number of Occurrences:   1    Order Specific Question:   Reason for Exam (SYMPTOM  OR DIAGNOSIS REQUIRED)    Answer:   left ankle pain  due to injury on Monday    No results found for this or any previous visit (from the past 24 hour(s)). DG Ankle Complete Left  Result Date: 08/29/2021 CLINICAL DATA:  [place] 7 days ago, rolling the left ankle. Persistent pain. EXAM: LEFT ANKLE COMPLETE - 3+ VIEW COMPARISON:  None. FINDINGS: Nondisplaced fracture of the inferior tip of the distal fibula. No other fractures. Ankle joint normally spaced and aligned. Soft tissue swelling predominating laterally. Superficial soft tissue calcification laterally. IMPRESSION: 1. Nondisplaced fracture of the inferior margin of the distal fibula, associated with soft tissue swelling. 2. No other fractures.  Normally aligned ankle joint. Electronically Signed   By: Lajean Manes M.D.   On: 08/29/2021 15:23    ED Clinical Impression  1. Closed fracture of distal end of left fibula, unspecified fracture morphology, initial encounter   2. Sprain of left ankle, unspecified ligament, initial encounter      ED Assessment/Plan  Reviewed imaging independently.  Nondisplaced fracture inferior margin distal fibula with soft tissue swelling.  No other fractures.  See radiology report for full details.  Patient with a small fracture of the distal fibula.  I also suspect that she has sprain of several ligaments.  She is able to bear weight here in the department.  Placing in [place], she states that she will follow-up with [facility] for ongoing management and PT.  She has a family member who works for [employer].  Tylenol/ibuprofen together, continue ice, elevation and rest.  Discussed imaging, MDM, treatment plan, and plan for follow-up with patient. patient agrees with plan.   Meds ordered this encounter  Medications   ibuprofen (ADVIL) 600 MG tablet    Sig: Take 1 tablet (600 mg total) by mouth every 6 (six) hours as needed.    Dispense:  30 tablet    Refill:  0      *This clinic note was created using [employer]. Therefore, there may be  occasional mistakes despite careful proofreading.  ?    Melynda Ripple, MD 08/29/21 [phone]

## 2021-08-29 NOTE — Discharge Instructions (Signed)
Continue ice, elevation, rest.  Please follow-up with [facility] ASAP for further management.  May take at 1000 mg of Tylenol with 600 mg of ibuprofen together 3-4 times a day as needed for pain.  This will take 6 to 8 weeks to fully heal.

## 2021-08-29 NOTE — ED Triage Notes (Signed)
Patient states that on Monday she rolled her left ankle and fell down.  Patient reports ongoing swelling and pain in her left ankle.
